# Patient Record
Sex: Male | Born: 1935 | Race: White | Hispanic: No | Marital: Married | Smoking: Never smoker
Health system: Southern US, Community
[De-identification: ages and names within clinical notes are randomized; demographics above are authoritative.]

## PROBLEM LIST (undated history)

## (undated) DIAGNOSIS — I2510 Atherosclerotic heart disease of native coronary artery without angina pectoris: Secondary | ICD-10-CM

## (undated) DIAGNOSIS — I509 Heart failure, unspecified: Secondary | ICD-10-CM

## (undated) DIAGNOSIS — Z950 Presence of cardiac pacemaker: Secondary | ICD-10-CM

## (undated) DIAGNOSIS — M1990 Unspecified osteoarthritis, unspecified site: Secondary | ICD-10-CM

## (undated) DIAGNOSIS — J9610 Chronic respiratory failure, unspecified whether with hypoxia or hypercapnia: Secondary | ICD-10-CM

## (undated) DIAGNOSIS — E119 Type 2 diabetes mellitus without complications: Secondary | ICD-10-CM

## (undated) DIAGNOSIS — R0602 Shortness of breath: Secondary | ICD-10-CM

## (undated) DIAGNOSIS — I739 Peripheral vascular disease, unspecified: Secondary | ICD-10-CM

## (undated) DIAGNOSIS — Z9862 Peripheral vascular angioplasty status: Secondary | ICD-10-CM

## (undated) DIAGNOSIS — J189 Pneumonia, unspecified organism: Secondary | ICD-10-CM

## (undated) DIAGNOSIS — F329 Major depressive disorder, single episode, unspecified: Secondary | ICD-10-CM

## (undated) DIAGNOSIS — I2699 Other pulmonary embolism without acute cor pulmonale: Secondary | ICD-10-CM

## (undated) DIAGNOSIS — I82403 Acute embolism and thrombosis of unspecified deep veins of lower extremity, bilateral: Secondary | ICD-10-CM

## (undated) DIAGNOSIS — F32A Depression, unspecified: Secondary | ICD-10-CM

## (undated) DIAGNOSIS — I219 Acute myocardial infarction, unspecified: Secondary | ICD-10-CM

## (undated) DIAGNOSIS — I209 Angina pectoris, unspecified: Secondary | ICD-10-CM

## (undated) DIAGNOSIS — N189 Chronic kidney disease, unspecified: Secondary | ICD-10-CM

## (undated) DIAGNOSIS — Z95810 Presence of automatic (implantable) cardiac defibrillator: Secondary | ICD-10-CM

## (undated) DIAGNOSIS — D696 Thrombocytopenia, unspecified: Secondary | ICD-10-CM

## (undated) DIAGNOSIS — I459 Conduction disorder, unspecified: Secondary | ICD-10-CM

## (undated) DIAGNOSIS — I10 Essential (primary) hypertension: Secondary | ICD-10-CM

## (undated) DIAGNOSIS — E785 Hyperlipidemia, unspecified: Secondary | ICD-10-CM

## (undated) DIAGNOSIS — J209 Acute bronchitis, unspecified: Secondary | ICD-10-CM

## (undated) DIAGNOSIS — K219 Gastro-esophageal reflux disease without esophagitis: Secondary | ICD-10-CM

## (undated) DIAGNOSIS — Q639 Congenital malformation of kidney, unspecified: Secondary | ICD-10-CM

## (undated) DIAGNOSIS — G4730 Sleep apnea, unspecified: Secondary | ICD-10-CM

## (undated) DIAGNOSIS — M109 Gout, unspecified: Secondary | ICD-10-CM

## (undated) DIAGNOSIS — I513 Intracardiac thrombosis, not elsewhere classified: Secondary | ICD-10-CM

## (undated) HISTORY — DX: Sleep apnea, unspecified: G47.30

## (undated) HISTORY — PX: CORONARY ANGIOPLASTY WITH STENT PLACEMENT: SHX49

## (undated) HISTORY — DX: Conduction disorder, unspecified: I45.9

## (undated) HISTORY — PX: REPLACEMENT TOTAL KNEE BILATERAL: SUR1225

## (undated) HISTORY — DX: Heart failure, unspecified: I50.9

## (undated) HISTORY — DX: Hyperlipidemia, unspecified: E78.5

## (undated) HISTORY — DX: Congenital malformation of kidney, unspecified: Q63.9

## (undated) HISTORY — PX: CORONARY ARTERY BYPASS GRAFT: SHX141

## (undated) HISTORY — DX: Morbid (severe) obesity due to excess calories: E66.01

## (undated) HISTORY — DX: Other pulmonary embolism without acute cor pulmonale: I26.99

## (undated) HISTORY — DX: Atherosclerotic heart disease of native coronary artery without angina pectoris: I25.10

## (undated) HISTORY — DX: Essential (primary) hypertension: I10

## (undated) HISTORY — PX: CHOLECYSTECTOMY: SHX55

## (undated) HISTORY — PX: JOINT REPLACEMENT: SHX530

## (undated) HISTORY — PX: EYE SURGERY: SHX253

## (undated) HISTORY — PX: CATARACT EXTRACTION W/ INTRAOCULAR LENS  IMPLANT, BILATERAL: SHX1307

## (undated) HISTORY — PX: OTHER SURGICAL HISTORY: SHX169

---

## 1999-02-25 ENCOUNTER — Inpatient Hospital Stay (HOSPITAL_COMMUNITY): Admission: AD | Admit: 1999-02-25 | Discharge: 1999-02-28 | Payer: Self-pay | Admitting: Cardiovascular Disease

## 1999-03-19 ENCOUNTER — Inpatient Hospital Stay (HOSPITAL_COMMUNITY): Admission: EM | Admit: 1999-03-19 | Discharge: 1999-03-21 | Payer: Self-pay | Admitting: Cardiovascular Disease

## 1999-03-19 ENCOUNTER — Encounter: Payer: Self-pay | Admitting: Cardiovascular Disease

## 1999-04-29 ENCOUNTER — Ambulatory Visit: Admission: RE | Admit: 1999-04-29 | Discharge: 1999-04-29 | Payer: Self-pay | Admitting: Internal Medicine

## 2001-04-14 ENCOUNTER — Encounter: Payer: Self-pay | Admitting: Orthopedic Surgery

## 2001-04-16 HISTORY — PX: SHOULDER OPEN ROTATOR CUFF REPAIR: SHX2407

## 2001-04-18 ENCOUNTER — Inpatient Hospital Stay (HOSPITAL_COMMUNITY): Admission: RE | Admit: 2001-04-18 | Discharge: 2001-04-19 | Payer: Self-pay | Admitting: Orthopedic Surgery

## 2002-07-27 ENCOUNTER — Ambulatory Visit (HOSPITAL_COMMUNITY): Admission: RE | Admit: 2002-07-27 | Discharge: 2002-07-27 | Payer: Self-pay | Admitting: Unknown Physician Specialty

## 2002-07-27 ENCOUNTER — Encounter: Payer: Self-pay | Admitting: Unknown Physician Specialty

## 2003-03-31 ENCOUNTER — Inpatient Hospital Stay (HOSPITAL_COMMUNITY): Admission: AD | Admit: 2003-03-31 | Discharge: 2003-04-03 | Payer: Self-pay | Admitting: Cardiovascular Disease

## 2003-08-29 ENCOUNTER — Ambulatory Visit (HOSPITAL_COMMUNITY): Admission: RE | Admit: 2003-08-29 | Discharge: 2003-08-29 | Payer: Self-pay | Admitting: Gastroenterology

## 2003-08-29 ENCOUNTER — Encounter (INDEPENDENT_AMBULATORY_CARE_PROVIDER_SITE_OTHER): Payer: Self-pay | Admitting: Specialist

## 2003-10-18 ENCOUNTER — Inpatient Hospital Stay (HOSPITAL_COMMUNITY): Admission: EM | Admit: 2003-10-18 | Discharge: 2003-10-23 | Payer: Self-pay | Admitting: Emergency Medicine

## 2004-06-23 ENCOUNTER — Inpatient Hospital Stay (HOSPITAL_COMMUNITY): Admission: AD | Admit: 2004-06-23 | Discharge: 2004-06-27 | Payer: Self-pay | Admitting: *Deleted

## 2004-07-02 ENCOUNTER — Inpatient Hospital Stay (HOSPITAL_COMMUNITY): Admission: RE | Admit: 2004-07-02 | Discharge: 2004-07-03 | Payer: Self-pay | Admitting: Cardiovascular Disease

## 2005-01-25 ENCOUNTER — Ambulatory Visit (HOSPITAL_COMMUNITY): Admission: RE | Admit: 2005-01-25 | Discharge: 2005-01-26 | Payer: Self-pay | Admitting: Cardiovascular Disease

## 2005-02-10 ENCOUNTER — Inpatient Hospital Stay (HOSPITAL_COMMUNITY): Admission: AD | Admit: 2005-02-10 | Discharge: 2005-02-16 | Payer: Self-pay | Admitting: Cardiology

## 2005-02-10 ENCOUNTER — Ambulatory Visit (HOSPITAL_COMMUNITY): Admission: RE | Admit: 2005-02-10 | Discharge: 2005-02-10 | Payer: Self-pay | Admitting: Cardiovascular Disease

## 2005-02-10 ENCOUNTER — Ambulatory Visit: Payer: Self-pay | Admitting: Internal Medicine

## 2005-03-22 ENCOUNTER — Ambulatory Visit (HOSPITAL_COMMUNITY): Admission: RE | Admit: 2005-03-22 | Discharge: 2005-03-22 | Payer: Self-pay | Admitting: Cardiovascular Disease

## 2005-04-02 ENCOUNTER — Ambulatory Visit: Payer: Self-pay | Admitting: Internal Medicine

## 2005-05-05 ENCOUNTER — Ambulatory Visit: Payer: Self-pay | Admitting: Internal Medicine

## 2005-06-03 ENCOUNTER — Ambulatory Visit: Payer: Self-pay | Admitting: Internal Medicine

## 2006-12-16 ENCOUNTER — Ambulatory Visit (HOSPITAL_COMMUNITY): Admission: RE | Admit: 2006-12-16 | Discharge: 2006-12-16 | Payer: Self-pay | Admitting: Cardiovascular Disease

## 2007-07-17 HISTORY — PX: INSERT / REPLACE / REMOVE PACEMAKER: SUR710

## 2007-08-10 ENCOUNTER — Ambulatory Visit: Payer: Self-pay | Admitting: Internal Medicine

## 2007-08-10 ENCOUNTER — Ambulatory Visit: Payer: Self-pay | Admitting: Pulmonary Disease

## 2007-08-10 ENCOUNTER — Inpatient Hospital Stay (HOSPITAL_COMMUNITY): Admission: EM | Admit: 2007-08-10 | Discharge: 2007-08-20 | Payer: Self-pay | Admitting: Emergency Medicine

## 2007-08-17 HISTORY — PX: OTHER SURGICAL HISTORY: SHX169

## 2007-08-25 ENCOUNTER — Encounter: Payer: Self-pay | Admitting: Internal Medicine

## 2007-09-04 ENCOUNTER — Ambulatory Visit: Payer: Self-pay

## 2007-11-21 ENCOUNTER — Ambulatory Visit: Payer: Self-pay | Admitting: Internal Medicine

## 2008-04-23 ENCOUNTER — Ambulatory Visit (HOSPITAL_COMMUNITY): Admission: RE | Admit: 2008-04-23 | Discharge: 2008-04-23 | Payer: Self-pay | Admitting: Internal Medicine

## 2008-11-04 ENCOUNTER — Encounter: Payer: Self-pay | Admitting: Internal Medicine

## 2009-03-05 ENCOUNTER — Encounter: Payer: Self-pay | Admitting: Internal Medicine

## 2009-09-09 ENCOUNTER — Encounter: Payer: Self-pay | Admitting: Internal Medicine

## 2010-03-03 ENCOUNTER — Inpatient Hospital Stay (HOSPITAL_COMMUNITY): Admission: EM | Admit: 2010-03-03 | Discharge: 2010-03-13 | Payer: Self-pay | Admitting: Cardiology

## 2010-03-04 ENCOUNTER — Encounter (INDEPENDENT_AMBULATORY_CARE_PROVIDER_SITE_OTHER): Payer: Self-pay | Admitting: Cardiology

## 2010-03-06 ENCOUNTER — Ambulatory Visit: Payer: Self-pay | Admitting: Cardiothoracic Surgery

## 2010-03-06 ENCOUNTER — Encounter: Payer: Self-pay | Admitting: Cardiothoracic Surgery

## 2010-04-15 ENCOUNTER — Emergency Department (HOSPITAL_COMMUNITY): Admission: EM | Admit: 2010-04-15 | Discharge: 2010-04-15 | Payer: Self-pay | Admitting: Emergency Medicine

## 2010-06-16 DIAGNOSIS — I219 Acute myocardial infarction, unspecified: Secondary | ICD-10-CM

## 2010-06-16 HISTORY — DX: Acute myocardial infarction, unspecified: I21.9

## 2010-08-11 ENCOUNTER — Inpatient Hospital Stay (HOSPITAL_COMMUNITY)
Admission: AD | Admit: 2010-08-11 | Discharge: 2010-08-21 | Payer: Self-pay | Source: Home / Self Care | Attending: Cardiovascular Disease | Admitting: Cardiovascular Disease

## 2010-08-13 ENCOUNTER — Encounter (INDEPENDENT_AMBULATORY_CARE_PROVIDER_SITE_OTHER): Payer: Self-pay | Admitting: Cardiovascular Disease

## 2010-08-19 LAB — GLUCOSE, CAPILLARY
Glucose-Capillary: 168 mg/dL — ABNORMAL HIGH (ref 70–99)
Glucose-Capillary: 173 mg/dL — ABNORMAL HIGH (ref 70–99)
Glucose-Capillary: 160 mg/dL — ABNORMAL HIGH (ref 70–99)
Glucose-Capillary: 159 mg/dL — ABNORMAL HIGH (ref 70–99)

## 2010-08-19 LAB — CBC
HCT: 41.2 % (ref 39.0–52.0)
Hemoglobin: 14 g/dL (ref 13.0–17.0)
MCH: 33.2 pg (ref 26.0–34.0)
MCHC: 34 g/dL (ref 30.0–36.0)
MCV: 97.6 fL (ref 78.0–100.0)
Platelets: 237 10*3/uL (ref 150–400)
RBC: 4.22 MIL/uL (ref 4.22–5.81)
RDW: 12.8 % (ref 11.5–15.5)
WBC: 6.8 10*3/uL (ref 4.0–10.5)

## 2010-08-19 LAB — PROTIME-INR
INR: 2.78 — ABNORMAL HIGH (ref 0.00–1.49)
Prothrombin Time: 29.4 seconds — ABNORMAL HIGH (ref 11.6–15.2)

## 2010-08-20 LAB — GLUCOSE, CAPILLARY
Glucose-Capillary: 162 mg/dL — ABNORMAL HIGH (ref 70–99)
Glucose-Capillary: 163 mg/dL — ABNORMAL HIGH (ref 70–99)
Glucose-Capillary: 145 mg/dL — ABNORMAL HIGH (ref 70–99)
Glucose-Capillary: 177 mg/dL — ABNORMAL HIGH (ref 70–99)

## 2010-08-20 LAB — BASIC METABOLIC PANEL
BUN: 34 mg/dL — ABNORMAL HIGH (ref 6–23)
CO2: 31 mEq/L (ref 19–32)
Calcium: 9.9 mg/dL (ref 8.4–10.5)
Chloride: 93 mEq/L — ABNORMAL LOW (ref 96–112)
Creatinine, Ser: 2.42 mg/dL — ABNORMAL HIGH (ref 0.4–1.5)
GFR calc Af Amer: 31 mL/min — ABNORMAL LOW (ref >60)
GFR calc non Af Amer: 26 mL/min — ABNORMAL LOW (ref >60)
Glucose, Bld: 157 mg/dL — ABNORMAL HIGH (ref 70–99)
Potassium: 3.6 mEq/L (ref 3.5–5.1)
Sodium: 137 mEq/L (ref 135–145)

## 2010-08-20 LAB — PROTIME-INR
INR: 2.98 — ABNORMAL HIGH (ref 0.00–1.49)
Prothrombin Time: 31 seconds — ABNORMAL HIGH (ref 11.6–15.2)

## 2010-08-20 LAB — MAGNESIUM: Magnesium: 2.2 mg/dL (ref 1.5–2.5)

## 2010-08-20 LAB — BRAIN NATRIURETIC PEPTIDE: Pro B Natriuretic peptide (BNP): 365 pg/mL — ABNORMAL HIGH (ref 0.0–100.0)

## 2010-08-21 LAB — PROTIME-INR
INR: 3.81 — ABNORMAL HIGH (ref 0.00–1.49)
Prothrombin Time: 37.5 seconds — ABNORMAL HIGH (ref 11.6–15.2)

## 2010-08-21 LAB — GLUCOSE, CAPILLARY
Glucose-Capillary: 159 mg/dL — ABNORMAL HIGH (ref 70–99)
Glucose-Capillary: 188 mg/dL — ABNORMAL HIGH (ref 70–99)

## 2010-09-04 DIAGNOSIS — G4730 Sleep apnea, unspecified: Secondary | ICD-10-CM | POA: Insufficient documentation

## 2010-09-04 DIAGNOSIS — E785 Hyperlipidemia, unspecified: Secondary | ICD-10-CM | POA: Insufficient documentation

## 2010-09-04 DIAGNOSIS — E119 Type 2 diabetes mellitus without complications: Secondary | ICD-10-CM | POA: Insufficient documentation

## 2010-09-04 DIAGNOSIS — M1990 Unspecified osteoarthritis, unspecified site: Secondary | ICD-10-CM | POA: Insufficient documentation

## 2010-09-04 DIAGNOSIS — K219 Gastro-esophageal reflux disease without esophagitis: Secondary | ICD-10-CM | POA: Insufficient documentation

## 2010-09-04 DIAGNOSIS — I10 Essential (primary) hypertension: Secondary | ICD-10-CM | POA: Insufficient documentation

## 2010-09-08 ENCOUNTER — Ambulatory Visit: Admit: 2010-09-08 | Payer: Self-pay | Attending: Physician Assistant | Admitting: Physician Assistant

## 2010-09-10 ENCOUNTER — Encounter: Payer: Self-pay | Admitting: Internal Medicine

## 2010-09-11 NOTE — Discharge Summary (Signed)
NAME:  Samuel Christensen, ARKO NO.:  [id number]  MEDICAL RECORD NO.:  [id number]          PATIENT TYPE:  INP  LOCATION:  2039                         FACILITY:  [facility]  PHYSICIAN:  Faye Strohman A. Alanda Amass, M.D.DATE OF BIRTH:  11/03/1924  DATE OF ADMISSION:  08/11/2010 DATE OF DISCHARGE:  08/21/2010                              DISCHARGE SUMMARY   DISCHARGE DIAGNOSES: 1. Acute-on-chronic congestive heart failure. 2. Ischemic cardiomyopathy with an ejection fraction of 25-30%. 3. Pulmonary embolism this admission in the setting of a     subtherapeutic INR. 4. History of previous pulmonary embolism in 2007. 5. Coronary artery disease with coronary artery bypass grafting in     1994, catheterization July 2011 with plans for medical therapy. 6. Stage IV chronic renal insufficiency. 7. Morbid obesity. 8. Sleep apnea, intolerant to CPAP. 9. Left bundle-branch block. 10.Peripheral vascular disease with prior right renal artery     percutaneous transluminal angioplasty November 2005.  HOSPITAL COURSE:  The patient is an 77 year old male well-known to Dr. Alanda Amass.  He presented to the office, fluid overloaded.  See Dr. Kandis Cocking complete H and P for details.  He was admitted to the telemetry, started on IV dobutamine and IV diuretics.  Dr. Alanda Amass wanted him evaluated by Dr. Graciela Husbands with the EP Service for possible upgrade of a previous pacemaker to a BiV ICD device.  The patient was admitted to the telemetry floor.  His INR at admission was 1.11 and he was put on heparin.  The patient was seen by Dr. Graciela Husbands.  After admission, the patient had dyspnea and a VQ scan on the 29th confirmed pulmonary embolism.  Lower extremity venous Dopplers showed positive for bilateral DVTs.  Echocardiogram showed his EF to be 20-25%.  The patient was transferred to the intensive care unit.  During this time he also had some increasing congestive heart failure and he was diuresed.  He was  monitored on dobutamine.  We were unable to keep an IV on him with the dobutamine going, so we put him on Lovenox to Coumadin.  His diuretics were adjusted over the holiday weekend and he continued to diurese.  He was transferred to telemetry and ambulated.  Dr. Graciela Husbands saw him on the 4th and suggested at this time that we continue to treat him medically.  He does not feel he is a candidate at this point for a upgrade to a BiV ICD.  Please see his consult note for complete details. The patient was seen by Dr. Lynnea Ferrier on the 6th and we feel he is ready for discharge.  His weight at discharge is 95 kg.  His weight on admission was 101.6 kg.  LABS AT DISCHARGE:  INR is 3.81.  BNP is 365.  Sodium 137, potassium 3.6, BUN 34, creatinine 2.42, white count 6.8, hemoglobin 14, hematocrit 41.2, platelets 237.  Chest x-ray on the 2nd shows an improved aeration. VQ scan on the 29th shows high probability for acute pulmonary embolism to the right lung.  Abdominal ultrasound showed fatty infiltration of the liver.  Did have a CT scan of his head without contrast that showed no  hemorrhage.  EKG shows a paced rhythm with a left bundle-branch block.  DISPOSITION:  The patient is discharged in stable condition.  He will need a INR on Monday.  We will hold his Coumadin today and give him his usual dose tomorrow.  He will see Dr. Alanda Amass in a week or so in followup.  Please see med rec for complete discharge medications.     Abelino Derrick, P.A.   ______________________________ Pearletha Furl Alanda Amass, M.D.    Lenard Lance  D:  08/21/2010  T:  08/21/2010  Job:  [id number]  Electronically Signed by Corine Shelter P.A. on 08/26/2010 10:09:58 AM Electronically Signed by Susa Griffins M.D. on 09/09/2010 12:47:18 PM

## 2010-09-11 NOTE — H&P (Signed)
NAME:  Samuel Christensen, Samuel Christensen NO.:  [id number]  MEDICAL RECORD NO.:  [id number]          PATIENT TYPE:  INP  LOCATION:  2040                         FACILITY:  [facility]  PHYSICIAN:  Richard A. Alanda Amass, M.D.DATE OF BIRTH:  06-22-1925  DATE OF ADMISSION:  08/11/2010 DATE OF DISCHARGE:                             HISTORY & PHYSICAL   Samuel Christensen is an 77 year old white married father of 7, with 2 GC and 13 GGC.  He has never been a smoker.  He has multiple significant cardiovascular problems and a complex medical history.  He has coronary artery disease, remote CABG, remote pulmonary emboli, obstructive sleep apnea on CPAP, worsening LV function, permanent pacemaker with pacer- induced left bundle, renal artery stenosis, renal insufficiency, severe graft disease with past SCMI in July 2011.  He is being admitted now with worsening shortness of breath over the last 3-4 days.  He has not had any chest pain or increasing edema and denies any significant dietary salt indiscretion over the Christmas holidays.  Samuel Christensen has remote CABG in 1994 by Dr. Andrey Campanile when he has had multiple interventions dating back to 2000 with predominantly non-DES stents. His last intervention was in December 2007 when he had DES stenting of the SVG to a small diagonal and the proximal and middle thirds and his other stents were patent in his native LAD (from 2000 intervention with an SVG to distal OM and distal circumflex).  He had patent graft to RV marginals and the PDA with an intercurrent 70% lesion of the PLA beyond the anastomosis.  He had 3 prior stents of the SVG sequential to the marginals that were patent at that time and this stented diagonal graft supplied a moderate-sized diagonal  He was recently restudied in July 2011 and was found to have reduction in EF to about 30% with inferior wall motion abnormality.  He had haziness with possibly representing resolved thrombus  without significant stenosis of the SVG to the right.  The SVG to the circumflex marginal and SVG to the diagonals were occluded.  His LIMA to LAD was atretic, but he had patent LAD.  It was recommended that he be continued on medical therapy for this and he had a pacer-induced left bundle.  He is being followed for renal insufficiency, salt water retention on diuretics as an outpatient and lisinopril is being discontinued when BUN and creatinine was elevated to 59/2.26.  He has chronic renal insufficiency, but may have had a component of contrast-induced nephropathy from his prior catheterization in July despite limited dye.  He has also had CO2 angiography with left renal dilatation and stenting in 2005.  He had refused angiography in 2006 which showed 40% right and 30% left renal artery in-stent restenosis.  There was mid residual lumen.  His last renal Dopplers showed no significant increase in velocities on August 06, 2010.  He has had bilateral pulmonary emboli in 2007 and has been treated with chronic Coumadin therapy. He had permanent pacemaker implanted in December 2008 for symptomatic bradycardia by Dr. Lewayne Bunting for associated intermittent heart block. He did require subsequent lead repositioning which was  successful by Dr. Ladona Ridgel.  We have felt that he would probably require consideration for ICD based on his worsening LV function and that he might be a candidate for BiV pacing however, this might require at least limited dye to assess him for the LV lead.  His last 2-D echo of July 02, 2010, showed an EF of 20-25% with pacer-induced left bundle and diastolic dysfunction.  Mild AI with aortic sclerosis and mild MR, left atrial size of 4.2.  He has not had any recent chest pain or nitroglycerin use.  He has been sleeping on 1 very large pillow equivalent to 2-3 pillows over the last 3 days and as mentioned he has not had any increase in edema, but has had  progressive shortness of breath.  He was brought into the office today by his wife and daughter.  Last laboratory of July 24, 2010, showed H&H of 14.5/45.9, BUN and creatinine was improved to 26/1.58 off lisinopril, glucose 135, normal LFTs, A1c of 6.6, and a BMP of 396.  Samuel Christensen has sleep apnea, on CPAP, exogenous obesity.  Permanent pacemaker was implanted in December 2008 as outlined above and is a [employer].  CURRENT MEDICATIONS: 1. TriCor 145. 2. Pepcid 20. 3. Vytorin 10/20. 4. Lasix 60. 5. Niacin 1 g. 6. Lexapro 20. 7. Bystolic 5. 8. Imdur 30. 9. Azatadine 2.5 mg 3 days, 5 mg 4 days, 10.M.V.I. 11.Fish oil. 12.Pepcid 20.  PHYSICAL EXAMINATION:  He has mild cyanosis of the lips and mild to moderate shortness of breath at rest and moderate shortness of breath or greater with limited exertion, getting up and down from the exam table. His O2 sat initially was 88% on room air, it came up to 90%.  His chest is clear to P&A (nonsmoker, never smoked).  He is short-statured, mesomorphic habitus, height 5 feet and 5.5 inch and weight 224.  BMI is 37.  He has mild JVD at 20-30 degrees, diminished breath sounds bilaterally with no moist rales and prolonged expiration, but no expiratory wheezes.  The PMI is in the left sixth ICS, just outside the [facility] poorly sustained.  There is paradoxical splitting of the second heart sound and a 2/6 systolic murmur at the left sternal border, probably representing TR.  There is no parasternal lift.  There is no AI murmur on exam.  Abdomen is profoundly obese.  Ventral hernia is asymptomatic and there is no abdominal tenderness.  Femoral is intact. DP and PT intact and there is no edema.  Underlying EKG shows complete heart block with an atrial rate of 95-100 and is pacer dependent down to a rate of 30.  He has a pacer-induced left bundle with atrial tracking and ventricular pacing and QRS duration of 0.204.  Of note, prior  EF on 2-D echo of February 2011 was 45% with the pacer-induced left bundle and subsequent 2-D echo showed significant deterioration with last EF of 20-25% on July 02, 2010.  He has not had any arrhythmias recorded, specifically there has AF, SVT, or v tach.  RA threshold 0.6 at 0.5, RV threshold 0.8 at 0.5.  R-waves are paced and he is pacer dependent down to 30 and P-waves measure 1.3 mV.  RA impedance 580, RV impedance 470 ohms.  The generator is a [employer] implanted in December 2008.  Samuel Christensen has had significant clinical decompensation with PND, orthopnea, worsening shortness of breath, and hypoxemia.  He has not had any chest pain to suggest SEMI or recurrent pulmonary  emboli and he remains on chronic Coumadin therapy.Samuel Christensen is being admitted to the hospital for treatment of LV dysfunction and CHF.  He will probably require inotropic therapy at present for his LV dysfunction.  I would recommend that he have EP evaluation for purposes of consideration for BiV upgrade with his chronic LV dysfunction and clinical CHF.  He has also had renal insufficiency.  I would recommend renal consultation and he has been followed by Dr. Marina Gravel in the past with labs and followup as outlined.  He may require extra diuresis.  He may require a V/Q scanning, although he has been on chronic Coumadin therapy which presumably has been therapeutic and we are trying to check into that since his protimes were done in [place].  He has not had any recurrent chest pain.  He has recurrent ischemia and he has significant coronary artery disease with occluded grafts to his circumflex and diagonal, patent native LAD which is stented with Cypher stent from remote PCI, patent sequential grafts to RV marginals, PDA, and PLA on prior and most recent catheterizations and no progression of renal artery stenosis on renal Dopplers.  ADMISSION DIAGNOSES: 1. Progressive left ventricular  dysfunction with pacer-induced left     bundle and congestive heart failure exacerbation worsening x3-4     days with recent chronic congestive heart failure. 2. ASHD, status post remote coronary artery bypass grafting, Dr.     Andrey Campanile, 1994. 3. Multiple prior percutaneous coronary interventions with remote drug-     eluting stent left anterior descending stenting, remote extensive     stenting of saphenous vein graft to distal circumflex sequential     and saphenous vein graft to diagonal in 2006. 4. Patent sequential saphenous vein graft to right ventricular     marginals, posterior descending artery, and posterolateral artery,     occluded saphenous vein graft to circumflex, occluded saphenous     vein graft to diagonal, atretic left internal mammary artery to     left anterior descending with patent native left anterior     descending on last catheterization in July 2011. 5. Chronic renal insufficiency, improved, off ACE therapy. 6. Remote bilateral pulmonary emboli in 2007. 7. Remote left renal artery stenting with CO2 angiography in November     2005.  No restenosis on recent followup renal Dopplers on August 06, 2010. 8. Exogenous obesity - ventral hernia. 9. Obstructive sleep apnea, chronic, continuous positive airway     pressure. 10.PTBP for heart block on August 11, 2007, subsequently lead     repositioning, Dr. Lewayne Bunting, pacer-induced left bundle. 11.Progressive congestive heart failure clinically and on 2-D echo as     outlined above, last 2-D echo July 02, 2010, ejection fraction     less than 25%. 12.Pacer-dependent underlying heart block, pacer-induced left bundle,     QRS duration 204. 13.Chronic renal insufficiency. 14.Hyperlipidemia.     Richard A. Alanda Amass, M.D.     RAW/MEDQ  D:  08/11/2010  T:  08/11/2010  Job:  [id number]  cc:   Wilber Bihari. Caryn Section, M.D. Larina Earthly, M.D.  Electronically Signed by Susa Griffins M.D. on 09/09/2010 12:47:21  PM

## 2010-09-17 NOTE — Letter (Signed)
Summary: [facility]  [facility]   Imported By: Sherian Rein 09/19/2009 08:50:59  _____________________________________________________________________  External Attachment:    Type:   Image     Comment:   External Document

## 2010-09-23 ENCOUNTER — Encounter: Payer: Self-pay | Admitting: Internal Medicine

## 2010-10-13 ENCOUNTER — Other Ambulatory Visit: Payer: Self-pay | Admitting: Internal Medicine

## 2010-10-13 ENCOUNTER — Encounter: Payer: Self-pay | Admitting: Internal Medicine

## 2010-10-13 ENCOUNTER — Ambulatory Visit (INDEPENDENT_AMBULATORY_CARE_PROVIDER_SITE_OTHER): Payer: Medicare Other | Admitting: Internal Medicine

## 2010-10-13 DIAGNOSIS — Z950 Presence of cardiac pacemaker: Secondary | ICD-10-CM

## 2010-10-13 DIAGNOSIS — I2782 Chronic pulmonary embolism: Secondary | ICD-10-CM | POA: Insufficient documentation

## 2010-10-13 DIAGNOSIS — I5022 Chronic systolic (congestive) heart failure: Secondary | ICD-10-CM | POA: Insufficient documentation

## 2010-10-13 DIAGNOSIS — J449 Chronic obstructive pulmonary disease, unspecified: Secondary | ICD-10-CM | POA: Insufficient documentation

## 2010-10-13 DIAGNOSIS — I2589 Other forms of chronic ischemic heart disease: Secondary | ICD-10-CM

## 2010-10-13 DIAGNOSIS — I442 Atrioventricular block, complete: Secondary | ICD-10-CM

## 2010-10-13 DIAGNOSIS — N259 Disorder resulting from impaired renal tubular function, unspecified: Secondary | ICD-10-CM

## 2010-10-13 DIAGNOSIS — J4489 Other specified chronic obstructive pulmonary disease: Secondary | ICD-10-CM | POA: Insufficient documentation

## 2010-10-13 LAB — BASIC METABOLIC PANEL
BUN: 33 mg/dL — ABNORMAL HIGH (ref 6–23)
CO2: 32 mEq/L (ref 19–32)
Chloride: 93 mEq/L — ABNORMAL LOW (ref 96–112)
GFR: 33.25 mL/min — ABNORMAL LOW (ref >60.00)
Potassium: 4.2 mEq/L (ref 3.5–5.1)
Sodium: 135 mEq/L (ref 135–145)

## 2010-10-15 HISTORY — PX: INSERT / REPLACE / REMOVE PACEMAKER: SUR710

## 2010-10-15 HISTORY — PX: CARDIAC DEFIBRILLATOR PLACEMENT: SHX171

## 2010-10-20 ENCOUNTER — Telehealth (INDEPENDENT_AMBULATORY_CARE_PROVIDER_SITE_OTHER): Payer: Self-pay | Admitting: *Deleted

## 2010-10-22 NOTE — Letter (Signed)
Summary: Implantable Device Instructions  Architectural technologist, Main Office  [address]   [place], [place] [address]   Phone: [phone]  Fax: [phone]      Implantable Device Instructions  You are scheduled for:  ___x__ Permanent Transvenous Pacemaker  DEVICE  UPGRADE _____ Implantable Cardioverter Defibrillator _____ Implantable Loop Recorder _____ Generator Change  on _3/15/12____ with Dr. _KLEIN____.  1.  Please arrive at the Short Stay Center at [facility] at __10:30__AM_ on the day of your procedure.  2.  Do not eat or drink the night before your procedure.  3.  Complete lab work on __3/12/12___.  The lab at [facility] is open from 8:30 AM to 1:30 PM and from 2:30 PM to 5:00 PM.  The lab at [facility] is open from 7:30 AM to 5:30 PM.  You do not have to be fasting.  4.  Do NOT take these medications for ____ days prior to your procedure:  _________________________.  Take your last dose of Coumadin on ________.  5.  Plan for an overnight stay.  Bring your insurance cards and a list of your medications.  6.  Wash your chest and neck with antibacterial soap (any brand) the evening before and the morning of your procedure.  Rinse well.             *If you have ANY questions after you get home, please call the office [phone].  *Every attempt is made to prevent procedures from being rescheduled.  Due to the nauture of Electrophysiology, rescheduling can happen.  The physician is always aware and directs the staff when this occurs.

## 2010-10-22 NOTE — Assessment & Plan Note (Signed)
Summary: Samuel Christensen   History of Present Illness: Samuel Christensen is seen following consultation at the hospital for consideration of CRT-P upgrade of DDD device in the context of complete heart block and ischemic CM with prior CABG 1991 EF by echo 07/2010 ws 25 %    He also has history of pulmonary embolism for whichhe has been on coumadin. at the last hospitalization, recurrent pulmonary embolism turned out to be because of the rapid deterioration in functional status that had prompted the hospitalization. Improve anticoagulation and time have left him mostly back to baseline. He is now able to ambulate around the house and lie flat although he does use oxygen.  His last catheterization in December 2000 less than on presentation with non-STEMI demonstrated Cardiac catheterization films are reviewed.  The left internal mammary  is not visualized and presume occluded, the circ vein graft is open  supplying a posterior descending, appears to have a distal anastomosis  stenosis.  The proximal circumflex of right occluded.  The vein grafts   to the circ diagonal are occluded with left ventricle appears underfilled, end-diastolic pressure was 24, ejection fraction 25-30%  with some degree of mitral regurgitation, PA pressure estimated at 37 with moderate-to- severe mitral regurgitation inferior hypokinesis. medical therapy was recommended.  he also has significant renal insufficiency. This has been a recurring theme. The most recent creatinine is January 5 was2.42. He tolerated his catheter in July quite well with a bump in his creatinine from 1.5-1.9.    Current Medications (verified): 1)  Imdur 30 Mg Xr24h-Tab (Isosorbide Mononitrate) .... Take One Tablet Once Daily 2)  Tricor 145 Mg Tabs (Fenofibrate) .... Take One Tablet Once Daily 3)  Bystolic 5 Mg Tabs (Nebivolol Hcl) .... Take One Tablet 4)  Pepcid 20 Mg Tabs (Famotidine) .... Take One Tablet Once Daily 5)  Lexapro 20 Mg Tabs (Escitalopram Oxalate) ....  Take One Tablet Once Daily 6)  Vytorin 10-20 Mg Tabs (Ezetimibe-Simvastatin) .... Take One Tablet Once Daily 7)  Niacinamide 500 Mg Tabs (Niacinamide) .... Take One Tablet Once Daily 8)  Jantoven 5 Mg Tabs (Warfarin Sodium) .... Take One Tablet 4 Days A Week and 1/2 Tablet 3 Days A Week 9)  Potassium Chloride Cr 10 Meq Cr-Tabs (Potassium Chloride) .... Take One Tablet Once Daily 10)  Furosemide 40 Mg Tabs (Furosemide) .Marland Kitchen.. 1 By Mouth Daily 11)  Metoprolol Tartrate 25 Mg Tabs (Metoprolol Tartrate) .... Take One Tablet Once Daily 12)  Centrum Silver  Tabs (Multiple Vitamins-Minerals) .... Once Daily 13)  Fish Oil 1000 Mg Caps (Omega-3 Fatty Acids) .... Take Two Capsules Once Daily  Allergies (verified): No Known Drug Allergies  Past History:  Past Surgical History: Last updated: 09/04/2010  1. Coronary artery bypass grafting x9 in 1991, we are still trying to       obtain the report of this operation.   2. History of permanent pacemaker in December 2008, when he had       bradycardic arrest requiring intubation secondary to complete heart       block and in early January 2009, had a revision of the pacer lead,       he has had bilateral knee replacements.   Family History: Last updated: 09/04/2010  He is married.  He has 7 children and 13 grandchildren.   He does not use cigarettes or alcohol.      His family history is noncontributory for congestive heart failure.      Social History: Last updated: 09/04/2010  The patient is married, retired, lives with his wife   who has recently been hospitalized for pneumonia and is currently living   in nursing home.  Denies alcohol use.      Past Medical History: 1. Renal insufficiency, concerned about contrast nephropathy.   2. Morbid obesity.   3. Sleep apnea.   coronary artery disease with prior bypass surgery Congestive heart failure-chronic-systolic Complete heart block status post pacemaker implantation Pulmonary  embolism-recurrent  7. Hypertension.   8. Dyslipidemia.   9. Diabetes.   Vital Signs:  Patient profile:   77 year old male Height:      65 inches Weight:      209 pounds BMI:     34.91 Pulse rate:   83 / minute BP sitting:   99 / 52  (left arm) Cuff size:   large  Vitals Entered By: Judithe Modest CMA (October 13, 2010 11:41 AM)  Physical Exam  General:  Well developed, he gets acutely obese white male appearing his stated age in mild respiratorydistress using oxygen Head:  normal HEENT Neck:  nondiscernible neck veins supple Chest Wall:  without kyphosis Lungs:  clear Heart:  regular rate and rhythm Abdomen:  soft nontender without hepatomegaly Msk:  Back normal, normal gait. Muscle strength and tone normal. Pulses:  intact distal pulses Extremities:  no clubbing cyanosis or edema Neurologic:  alert and oriented decreased hearing grossly normal motor and sensory function walks with a stick Skin:  warm and dry Psych:  engaging affect   PPM Specifications Following MD:  Inactive     Referring MD:  Susa Griffins, MD PPM Vendor:  [employer]     PPM Model Number:  [id number]     PPM Serial Number:  [id number] PPM DOI:  08/11/2007     PPM Implanting MD:  Sherryl Manges, MD  Lead 1    Location: RV     DOI: 08/18/2007     Model #: [id number]     Serial #: [id number]     Status: active  Magnet Response Rate:  BOL 100 ERI  85  Indications:  CHB   Impression & Recommendations:  Problem # 1:  SYSTOLIC HEART FAILURE, CHRONIC (ICD-428.22) the patient has chronic congestive heart failure. He is somewhat improved following hospitalization for pulmonary embolism. He has persistent class III symptoms. We have discussed the potential benefits and risks of upgrade of his DDD pacemaker to CRT P. The latter include but are not limited to death perforation, infection and renal injury. He understands these risks. We will measure his creatinine today. I will then discuss with Dr. Caryn Section with her  preprocedural hydration might be of benefit.  We will plan to undertake access without venography initially.  His updated medication list for this problem includes:    Imdur 30 Mg Xr24h-tab (Isosorbide mononitrate) .Marland Kitchen... Take one tablet once daily    Bystolic 5 Mg Tabs (Nebivolol hcl) .Marland Kitchen... Take one tablet    Jantoven 5 Mg Tabs (Warfarin sodium) .[employer] Kitchen... Take one tablet 4 days a week and 1/2 tablet 3 days a week    Furosemide 40 Mg Tabs (Furosemide) .Marland Kitchen... 1 by mouth daily    Metoprolol Tartrate 25 Mg Tabs (Metoprolol tartrate) .Marland Kitchen... Take one tablet once daily  Problem # 2:  CARDIOMYOPATHY, ISCHEMIC (ICD-414.8)  medical therapy is our option here and will be continued.  His updated medication list for this problem includes:    Imdur 30 Mg Xr24h-tab (Isosorbide mononitrate) .Marland Kitchen... Take one tablet once  daily    Bystolic 5 Mg Tabs (Nebivolol hcl) .Marland Kitchen... Take one tablet    Jantoven 5 Mg Tabs (Warfarin sodium) .Marland Kitchen... Take one tablet 4 days a week and 1/2 tablet 3 days a week    Furosemide 40 Mg Tabs (Furosemide) .Marland Kitchen... 1 by mouth daily    Metoprolol Tartrate 25 Mg Tabs (Metoprolol tartrate) .Marland Kitchen... Take one tablet once daily  Problem # 3:  RENAL DISEASE, CHRONIC, STAGE IV (ICD-585.4) as above  Problem # 4:  CHRONIC PULMONARY EMBOLISM (ICD-416.2) stable on current regime  Problem # 5:  PACEMAKER, PERMANENT (ICD-V45.01) the patient has a [employer] pacemaker which will be submitted for CRT upgrade  Other Orders: TLB-BMP (Basic Metabolic Panel-BMET) (80048-METABOL)

## 2010-10-26 ENCOUNTER — Other Ambulatory Visit (INDEPENDENT_AMBULATORY_CARE_PROVIDER_SITE_OTHER): Payer: Medicare Other

## 2010-10-26 ENCOUNTER — Other Ambulatory Visit: Payer: Self-pay | Admitting: Internal Medicine

## 2010-10-26 ENCOUNTER — Encounter: Payer: Self-pay | Admitting: Internal Medicine

## 2010-10-26 DIAGNOSIS — Z01810 Encounter for preprocedural cardiovascular examination: Secondary | ICD-10-CM

## 2010-10-26 DIAGNOSIS — I2589 Other forms of chronic ischemic heart disease: Secondary | ICD-10-CM

## 2010-10-26 DIAGNOSIS — N184 Chronic kidney disease, stage 4 (severe): Secondary | ICD-10-CM

## 2010-10-26 DIAGNOSIS — I509 Heart failure, unspecified: Secondary | ICD-10-CM

## 2010-10-26 LAB — GLUCOSE, CAPILLARY
Glucose-Capillary: 136 mg/dL — ABNORMAL HIGH (ref 70–99)
Glucose-Capillary: 146 mg/dL — ABNORMAL HIGH (ref 70–99)
Glucose-Capillary: 152 mg/dL — ABNORMAL HIGH (ref 70–99)
Glucose-Capillary: 165 mg/dL — ABNORMAL HIGH (ref 70–99)
Glucose-Capillary: 171 mg/dL — ABNORMAL HIGH (ref 70–99)
Glucose-Capillary: 173 mg/dL — ABNORMAL HIGH (ref 70–99)
Glucose-Capillary: 176 mg/dL — ABNORMAL HIGH (ref 70–99)
Glucose-Capillary: 179 mg/dL — ABNORMAL HIGH (ref 70–99)
Glucose-Capillary: 183 mg/dL — ABNORMAL HIGH (ref 70–99)
Glucose-Capillary: 212 mg/dL — ABNORMAL HIGH (ref 70–99)
Glucose-Capillary: 229 mg/dL — ABNORMAL HIGH (ref 70–99)

## 2010-10-26 LAB — BASIC METABOLIC PANEL
BUN: 31 mg/dL — ABNORMAL HIGH (ref 6–23)
CO2: 24 mEq/L (ref 19–32)
CO2: 31 mEq/L (ref 19–32)
Calcium: 10.1 mg/dL (ref 8.4–10.5)
Calcium: 9.7 mg/dL (ref 8.4–10.5)
Chloride: 100 mEq/L (ref 96–112)
Chloride: 95 mEq/L — ABNORMAL LOW (ref 96–112)
Chloride: 96 mEq/L (ref 96–112)
Chloride: 99 mEq/L (ref 96–112)
Creatinine, Ser: 2.05 mg/dL — ABNORMAL HIGH (ref 0.4–1.5)
GFR calc Af Amer: 34 mL/min — ABNORMAL LOW (ref >60)
GFR calc Af Amer: 36 mL/min — ABNORMAL LOW (ref >60)
GFR calc Af Amer: 37 mL/min — ABNORMAL LOW (ref >60)
GFR calc Af Amer: 38 mL/min — ABNORMAL LOW (ref >60)
GFR calc Af Amer: 39 mL/min — ABNORMAL LOW (ref >60)
GFR calc non Af Amer: 30 mL/min — ABNORMAL LOW (ref >60)
GFR calc non Af Amer: 33 mL/min — ABNORMAL LOW (ref >60)
Glucose, Bld: 160 mg/dL — ABNORMAL HIGH (ref 70–99)
Glucose, Bld: 213 mg/dL — ABNORMAL HIGH (ref 70–99)
Potassium: 3.5 mEq/L (ref 3.5–5.1)
Potassium: 4.3 mEq/L (ref 3.5–5.1)
Potassium: 4.3 mEq/L (ref 3.5–5.1)
Sodium: 136 mEq/L (ref 135–145)
Sodium: 138 mEq/L (ref 135–145)
Sodium: 139 mEq/L (ref 135–145)
Sodium: 139 mEq/L (ref 135–145)
Sodium: 135 mEq/L (ref 135–145)

## 2010-10-26 LAB — PROTIME-INR
INR: 1.17 (ref 0.00–1.49)
INR: 1.18 (ref 0.00–1.49)
INR: 1.25 (ref 0.00–1.49)
INR: 1.29 (ref 0.00–1.49)
INR: 1.8 — ABNORMAL HIGH (ref 0.00–1.49)
Prothrombin Time: 15.4 seconds — ABNORMAL HIGH (ref 11.6–15.2)
Prothrombin Time: 15.9 seconds — ABNORMAL HIGH (ref 11.6–15.2)
Prothrombin Time: 16.3 seconds — ABNORMAL HIGH (ref 11.6–15.2)
Prothrombin Time: 21.1 seconds — ABNORMAL HIGH (ref 11.6–15.2)

## 2010-10-26 LAB — COMPREHENSIVE METABOLIC PANEL
ALT: 15 U/L (ref 0–53)
ALT: 16 U/L (ref 0–53)
ALT: 20 U/L (ref 0–53)
AST: 24 U/L (ref 0–37)
AST: 31 U/L (ref 0–37)
Albumin: 2.6 g/dL — ABNORMAL LOW (ref 3.5–5.2)
Albumin: 3.3 g/dL — ABNORMAL LOW (ref 3.5–5.2)
Albumin: 3.9 g/dL (ref 3.5–5.2)
Alkaline Phosphatase: 41 U/L (ref 39–117)
CO2: 26 mEq/L (ref 19–32)
CO2: 28 mEq/L (ref 19–32)
CO2: 29 mEq/L (ref 19–32)
Calcium: 9.2 mg/dL (ref 8.4–10.5)
Calcium: 9.7 mg/dL (ref 8.4–10.5)
Chloride: 101 mEq/L (ref 96–112)
Chloride: 102 mEq/L (ref 96–112)
Chloride: 93 mEq/L — ABNORMAL LOW (ref 96–112)
Creatinine, Ser: 1.84 mg/dL — ABNORMAL HIGH (ref 0.4–1.5)
Creatinine, Ser: 1.93 mg/dL — ABNORMAL HIGH (ref 0.4–1.5)
GFR calc Af Amer: 36 mL/min — ABNORMAL LOW (ref >60)
GFR calc Af Amer: 40 mL/min — ABNORMAL LOW (ref >60)
GFR calc Af Amer: 43 mL/min — ABNORMAL LOW (ref >60)
GFR calc non Af Amer: 30 mL/min — ABNORMAL LOW (ref >60)
GFR calc non Af Amer: 33 mL/min — ABNORMAL LOW (ref >60)
Potassium: 3.6 mEq/L (ref 3.5–5.1)
Potassium: 4.7 mEq/L (ref 3.5–5.1)
Sodium: 137 mEq/L (ref 135–145)
Total Bilirubin: 0.9 mg/dL (ref 0.3–1.2)
Total Bilirubin: 0.9 mg/dL (ref 0.3–1.2)
Total Protein: 7.4 g/dL (ref 6.0–8.3)

## 2010-10-26 LAB — MAGNESIUM: Magnesium: 2 mg/dL (ref 1.5–2.5)

## 2010-10-26 LAB — CBC WITH DIFFERENTIAL/PLATELET
Basophils Absolute: 0 10*3/uL (ref 0.0–0.1)
Eosinophils Absolute: 0.2 10*3/uL (ref 0.0–0.7)
HCT: 40 % (ref 39.0–52.0)
Hemoglobin: 13.9 g/dL (ref 13.0–17.0)
Lymphs Abs: 1.8 10*3/uL (ref 0.7–4.0)
MCHC: 34.9 g/dL (ref 30.0–36.0)
Neutro Abs: 3.1 10*3/uL (ref 1.4–7.7)
Platelets: 179 10*3/uL (ref 150.0–400.0)
RDW: 14.1 % (ref 11.5–14.6)

## 2010-10-26 LAB — BRAIN NATRIURETIC PEPTIDE
Pro B Natriuretic peptide (BNP): 1104 pg/mL — ABNORMAL HIGH (ref 0.0–100.0)
Pro B Natriuretic peptide (BNP): 544 pg/mL — ABNORMAL HIGH (ref 0.0–100.0)
Pro B Natriuretic peptide (BNP): 551 pg/mL — ABNORMAL HIGH (ref 0.0–100.0)
Pro B Natriuretic peptide (BNP): 822 pg/mL — ABNORMAL HIGH (ref 0.0–100.0)

## 2010-10-26 LAB — CBC
HCT: 36.6 % — ABNORMAL LOW (ref 39.0–52.0)
HCT: 40.3 % (ref 39.0–52.0)
HCT: 41.7 % (ref 39.0–52.0)
HCT: 43.8 % (ref 39.0–52.0)
Hemoglobin: 12.1 g/dL — ABNORMAL LOW (ref 13.0–17.0)
Hemoglobin: 13.3 g/dL (ref 13.0–17.0)
Hemoglobin: 13.5 g/dL (ref 13.0–17.0)
Hemoglobin: 14.1 g/dL (ref 13.0–17.0)
MCH: 32.3 pg (ref 26.0–34.0)
MCH: 32.7 pg (ref 26.0–34.0)
MCHC: 32.5 g/dL (ref 30.0–36.0)
MCHC: 33.8 g/dL (ref 30.0–36.0)
MCV: 98.9 fL (ref 78.0–100.0)
MCV: 99.5 fL (ref 78.0–100.0)
MCV: 99.5 fL (ref 78.0–100.0)
Platelets: 136 10*3/uL — ABNORMAL LOW (ref 150–400)
Platelets: 137 10*3/uL — ABNORMAL LOW (ref 150–400)
Platelets: 143 10*3/uL — ABNORMAL LOW (ref 150–400)
Platelets: 154 10*3/uL (ref 150–400)
Platelets: 180 10*3/uL (ref 150–400)
RBC: 3.69 MIL/uL — ABNORMAL LOW (ref 4.22–5.81)
RBC: 3.7 MIL/uL — ABNORMAL LOW (ref 4.22–5.81)
RBC: 3.88 MIL/uL — ABNORMAL LOW (ref 4.22–5.81)
RBC: 4.05 MIL/uL — ABNORMAL LOW (ref 4.22–5.81)
RBC: 4.13 MIL/uL — ABNORMAL LOW (ref 4.22–5.81)
RBC: 4.27 MIL/uL (ref 4.22–5.81)
RDW: 13.1 % (ref 11.5–15.5)
RDW: 13.6 % (ref 11.5–15.5)
WBC: 10.1 10*3/uL (ref 4.0–10.5)
WBC: 6.5 10*3/uL (ref 4.0–10.5)
WBC: 6.8 10*3/uL (ref 4.0–10.5)
WBC: 7.2 10*3/uL (ref 4.0–10.5)
WBC: 8.6 10*3/uL (ref 4.0–10.5)
WBC: 9.1 10*3/uL (ref 4.0–10.5)
WBC: 9.5 10*3/uL (ref 4.0–10.5)

## 2010-10-26 LAB — DIFFERENTIAL
Basophils Relative: 0 % (ref 0–1)
Eosinophils Absolute: 0.1 10*3/uL (ref 0.0–0.7)
Eosinophils Relative: 1 % (ref 0–5)
Lymphs Abs: 2 10*3/uL (ref 0.7–4.0)
Monocytes Absolute: 0.8 10*3/uL (ref 0.1–1.0)
Monocytes Relative: 8 % (ref 3–12)

## 2010-10-26 LAB — HEPATIC FUNCTION PANEL
AST: 23 U/L (ref 0–37)
Bilirubin, Direct: 0.3 mg/dL (ref 0.0–0.3)
Total Bilirubin: 0.9 mg/dL (ref 0.3–1.2)

## 2010-10-26 LAB — URINALYSIS, ROUTINE W REFLEX MICROSCOPIC
Hgb urine dipstick: NEGATIVE
Nitrite: NEGATIVE
Protein, ur: NEGATIVE mg/dL
Specific Gravity, Urine: 1.017 (ref 1.005–1.030)
pH: 5 (ref 5.0–8.0)

## 2010-10-26 LAB — BLOOD GAS, ARTERIAL
Bicarbonate: 25.3 mEq/L — ABNORMAL HIGH (ref 20.0–24.0)
O2 Content: 6 L/min
pH, Arterial: 7.428 (ref 7.350–7.450)

## 2010-10-26 LAB — HEMOGLOBIN A1C: Mean Plasma Glucose: 148 mg/dL — ABNORMAL HIGH (ref <117)

## 2010-10-26 LAB — APTT
aPTT: 82 seconds — ABNORMAL HIGH (ref 24–37)
aPTT: 26.7 s (ref 21.7–28.8)

## 2010-10-26 LAB — CARDIAC PANEL(CRET KIN+CKTOT+MB+TROPI)
Relative Index: INVALID (ref 0.0–2.5)
Relative Index: INVALID (ref 0.0–2.5)
Relative Index: INVALID (ref 0.0–2.5)
Relative Index: INVALID (ref 0.0–2.5)
Total CK: 75 U/L (ref 7–232)
Troponin I: 0.07 ng/mL — ABNORMAL HIGH (ref 0.00–0.06)

## 2010-10-26 LAB — HEPARIN LEVEL (UNFRACTIONATED)
Heparin Unfractionated: 0.39 IU/mL (ref 0.30–0.70)
Heparin Unfractionated: 0.46 IU/mL (ref 0.30–0.70)

## 2010-10-26 LAB — TSH: TSH: 1.439 u[IU]/mL (ref 0.350–4.500)

## 2010-10-26 LAB — MRSA PCR SCREENING: MRSA by PCR: NEGATIVE

## 2010-10-26 LAB — LIPASE, BLOOD: Lipase: 30 U/L (ref 11–59)

## 2010-10-27 NOTE — Letter (Signed)
Summary: [facility]   Imported By: Marylou Mccoy 10/20/2010 15:39:12  _____________________________________________________________________  External Attachment:    Type:   Image     Comment:   External Document

## 2010-10-27 NOTE — Progress Notes (Signed)
Summary: pt rtn call  Phone Note Outgoing Call   Call placed by: Scherrie Bateman, LPN,  October 20, 2010 10:37 AM Call placed to: Patient Summary of Call: LM FOR PT TO CALL BACK  NEEDING TO INFORM PT TOHOLD FUROSEMIDE AM OF DEVICE UPGRADE. Initial call taken by: Scherrie Bateman, LPN,  October 20, 2010 10:38 AM  Follow-up for Phone Call        pt rtn call and they will be home so you can rtn call Omer Jack  October 20, 2010 2:02 PM   Additional Follow-up for Phone Call Additional follow up Details #1::        Phone call completed PT'S WIFE AWARE TO HAVE PT HOLD FURSOEMIDE AM OF PROCEDURE ALSO WIFE CANNOT LOCATE INSTRUCTION SHEET REVIEWED INSTRUCTIONS  OVER PHONE AND MAILED COPY AS WELL. Additional Follow-up by: Scherrie Bateman, LPN,  October 21, 2010 11:11 AM

## 2010-10-29 ENCOUNTER — Inpatient Hospital Stay (HOSPITAL_COMMUNITY)
Admission: RE | Admit: 2010-10-29 | Discharge: 2010-10-30 | DRG: 243 | Disposition: A | Discharge status: Home / Self Care | Payer: Medicare Other | Source: Ambulatory Visit | Attending: Internal Medicine | Admitting: Internal Medicine

## 2010-10-29 DIAGNOSIS — Z951 Presence of aortocoronary bypass graft: Secondary | ICD-10-CM

## 2010-10-29 DIAGNOSIS — G4730 Sleep apnea, unspecified: Secondary | ICD-10-CM | POA: Diagnosis present

## 2010-10-29 DIAGNOSIS — I5022 Chronic systolic (congestive) heart failure: Secondary | ICD-10-CM | POA: Diagnosis present

## 2010-10-29 DIAGNOSIS — I2510 Atherosclerotic heart disease of native coronary artery without angina pectoris: Secondary | ICD-10-CM | POA: Diagnosis present

## 2010-10-29 DIAGNOSIS — E669 Obesity, unspecified: Secondary | ICD-10-CM | POA: Diagnosis present

## 2010-10-29 DIAGNOSIS — N183 Chronic kidney disease, stage 3 unspecified: Secondary | ICD-10-CM | POA: Diagnosis present

## 2010-10-29 DIAGNOSIS — E119 Type 2 diabetes mellitus without complications: Secondary | ICD-10-CM | POA: Diagnosis present

## 2010-10-29 DIAGNOSIS — I2589 Other forms of chronic ischemic heart disease: Principal | ICD-10-CM | POA: Diagnosis present

## 2010-10-29 DIAGNOSIS — I509 Heart failure, unspecified: Secondary | ICD-10-CM | POA: Diagnosis present

## 2010-10-29 DIAGNOSIS — Z86711 Personal history of pulmonary embolism: Secondary | ICD-10-CM

## 2010-10-29 DIAGNOSIS — E785 Hyperlipidemia, unspecified: Secondary | ICD-10-CM | POA: Diagnosis present

## 2010-10-29 DIAGNOSIS — I442 Atrioventricular block, complete: Secondary | ICD-10-CM | POA: Diagnosis present

## 2010-10-29 DIAGNOSIS — I129 Hypertensive chronic kidney disease with stage 1 through stage 4 chronic kidney disease, or unspecified chronic kidney disease: Secondary | ICD-10-CM | POA: Diagnosis present

## 2010-10-29 DIAGNOSIS — I059 Rheumatic mitral valve disease, unspecified: Secondary | ICD-10-CM | POA: Diagnosis present

## 2010-10-29 LAB — COMPREHENSIVE METABOLIC PANEL
ALT: 21 U/L (ref 0–53)
AST: 31 U/L (ref 0–37)
Alkaline Phosphatase: 27 U/L — ABNORMAL LOW (ref 39–117)
CO2: 28 mEq/L (ref 19–32)
Calcium: 9.7 mg/dL (ref 8.4–10.5)
Chloride: 104 mEq/L (ref 96–112)
GFR calc Af Amer: 52 mL/min — ABNORMAL LOW (ref >60)
GFR calc non Af Amer: 43 mL/min — ABNORMAL LOW (ref >60)
Potassium: 3.9 mEq/L (ref 3.5–5.1)
Sodium: 141 mEq/L (ref 135–145)
Total Bilirubin: 1.8 mg/dL — ABNORMAL HIGH (ref 0.3–1.2)

## 2010-10-29 LAB — DIFFERENTIAL
Basophils Relative: 1 % (ref 0–1)
Eosinophils Absolute: 0.1 10*3/uL (ref 0.0–0.7)
Eosinophils Relative: 2 % (ref 0–5)
Lymphs Abs: 1.2 10*3/uL (ref 0.7–4.0)

## 2010-10-29 LAB — CBC
Hemoglobin: 12.5 g/dL — ABNORMAL LOW (ref 13.0–17.0)
RBC: 3.73 MIL/uL — ABNORMAL LOW (ref 4.22–5.81)
WBC: 6.4 10*3/uL (ref 4.0–10.5)

## 2010-10-29 LAB — TYPE AND SCREEN

## 2010-10-29 LAB — PROTIME-INR
INR: 1.92 — ABNORMAL HIGH (ref 0.00–1.49)
Prothrombin Time: 15.7 seconds — ABNORMAL HIGH (ref 11.6–15.2)

## 2010-10-29 LAB — GLUCOSE, CAPILLARY: Glucose-Capillary: 241 mg/dL — ABNORMAL HIGH (ref 70–99)

## 2010-10-30 ENCOUNTER — Observation Stay (HOSPITAL_COMMUNITY): Payer: Medicare Other

## 2010-10-30 DIAGNOSIS — I442 Atrioventricular block, complete: Secondary | ICD-10-CM

## 2010-10-30 LAB — PROTIME-INR
INR: 1.91 — ABNORMAL HIGH (ref 0.00–1.49)
Prothrombin Time: 22 seconds — ABNORMAL HIGH (ref 11.6–15.2)

## 2010-10-30 LAB — GLUCOSE, CAPILLARY: Glucose-Capillary: 202 mg/dL — ABNORMAL HIGH (ref 70–99)

## 2010-10-31 LAB — GLUCOSE, CAPILLARY
Glucose-Capillary: 131 mg/dL — ABNORMAL HIGH (ref 70–99)
Glucose-Capillary: 131 mg/dL — ABNORMAL HIGH (ref 70–99)
Glucose-Capillary: 133 mg/dL — ABNORMAL HIGH (ref 70–99)
Glucose-Capillary: 133 mg/dL — ABNORMAL HIGH (ref 70–99)
Glucose-Capillary: 135 mg/dL — ABNORMAL HIGH (ref 70–99)
Glucose-Capillary: 136 mg/dL — ABNORMAL HIGH (ref 70–99)
Glucose-Capillary: 138 mg/dL — ABNORMAL HIGH (ref 70–99)
Glucose-Capillary: 138 mg/dL — ABNORMAL HIGH (ref 70–99)
Glucose-Capillary: 140 mg/dL — ABNORMAL HIGH (ref 70–99)
Glucose-Capillary: 140 mg/dL — ABNORMAL HIGH (ref 70–99)
Glucose-Capillary: 141 mg/dL — ABNORMAL HIGH (ref 70–99)
Glucose-Capillary: 142 mg/dL — ABNORMAL HIGH (ref 70–99)
Glucose-Capillary: 144 mg/dL — ABNORMAL HIGH (ref 70–99)
Glucose-Capillary: 148 mg/dL — ABNORMAL HIGH (ref 70–99)
Glucose-Capillary: 149 mg/dL — ABNORMAL HIGH (ref 70–99)
Glucose-Capillary: 149 mg/dL — ABNORMAL HIGH (ref 70–99)
Glucose-Capillary: 150 mg/dL — ABNORMAL HIGH (ref 70–99)
Glucose-Capillary: 152 mg/dL — ABNORMAL HIGH (ref 70–99)
Glucose-Capillary: 160 mg/dL — ABNORMAL HIGH (ref 70–99)
Glucose-Capillary: 166 mg/dL — ABNORMAL HIGH (ref 70–99)
Glucose-Capillary: 166 mg/dL — ABNORMAL HIGH (ref 70–99)
Glucose-Capillary: 172 mg/dL — ABNORMAL HIGH (ref 70–99)
Glucose-Capillary: 183 mg/dL — ABNORMAL HIGH (ref 70–99)

## 2010-10-31 LAB — DIFFERENTIAL
Basophils Absolute: 0 10*3/uL (ref 0.0–0.1)
Basophils Relative: 0 % (ref 0–1)
Basophils Relative: 0 % (ref 0–1)
Eosinophils Absolute: 0 10*3/uL (ref 0.0–0.7)
Eosinophils Relative: 5 % (ref 0–5)
Monocytes Absolute: 0.4 10*3/uL (ref 0.1–1.0)
Monocytes Relative: 2 % — ABNORMAL LOW (ref 3–12)
Neutrophils Relative %: 89 % — ABNORMAL HIGH (ref 43–77)

## 2010-10-31 LAB — CBC
HCT: 28.1 % — ABNORMAL LOW (ref 39.0–52.0)
HCT: 34 % — ABNORMAL LOW (ref 39.0–52.0)
HCT: 35.9 % — ABNORMAL LOW (ref 39.0–52.0)
HCT: 36 % — ABNORMAL LOW (ref 39.0–52.0)
HCT: 36.3 % — ABNORMAL LOW (ref 39.0–52.0)
HCT: 36.3 % — ABNORMAL LOW (ref 39.0–52.0)
HCT: 36.7 % — ABNORMAL LOW (ref 39.0–52.0)
Hemoglobin: 11.5 g/dL — ABNORMAL LOW (ref 13.0–17.0)
Hemoglobin: 11.8 g/dL — ABNORMAL LOW (ref 13.0–17.0)
Hemoglobin: 12.3 g/dL — ABNORMAL LOW (ref 13.0–17.0)
Hemoglobin: 12.3 g/dL — ABNORMAL LOW (ref 13.0–17.0)
Hemoglobin: 12.4 g/dL — ABNORMAL LOW (ref 13.0–17.0)
Hemoglobin: 12.6 g/dL — ABNORMAL LOW (ref 13.0–17.0)
MCH: 32.3 pg (ref 26.0–34.0)
MCH: 32.8 pg (ref 26.0–34.0)
MCH: 32.8 pg (ref 26.0–34.0)
MCH: 33 pg (ref 26.0–34.0)
MCHC: 33.9 g/dL (ref 30.0–36.0)
MCHC: 34.1 g/dL (ref 30.0–36.0)
MCHC: 34.1 g/dL (ref 30.0–36.0)
MCHC: 34.2 g/dL (ref 30.0–36.0)
MCHC: 34.3 g/dL (ref 30.0–36.0)
MCV: 95.6 fL (ref 78.0–100.0)
MCV: 96.2 fL (ref 78.0–100.0)
MCV: 96.2 fL (ref 78.0–100.0)
MCV: 96.5 fL (ref 78.0–100.0)
MCV: 96.7 fL (ref 78.0–100.0)
MCV: 96.8 fL (ref 78.0–100.0)
MCV: 96.8 fL (ref 78.0–100.0)
Platelets: 187 10*3/uL (ref 150–400)
Platelets: 247 10*3/uL (ref 150–400)
Platelets: 248 10*3/uL (ref 150–400)
Platelets: 253 10*3/uL (ref 150–400)
Platelets: 264 10*3/uL (ref 150–400)
Platelets: 279 10*3/uL (ref 150–400)
RBC: 3.51 MIL/uL — ABNORMAL LOW (ref 4.22–5.81)
RBC: 3.62 MIL/uL — ABNORMAL LOW (ref 4.22–5.81)
RBC: 3.72 MIL/uL — ABNORMAL LOW (ref 4.22–5.81)
RBC: 3.75 MIL/uL — ABNORMAL LOW (ref 4.22–5.81)
RBC: 3.76 MIL/uL — ABNORMAL LOW (ref 4.22–5.81)
RBC: 3.76 MIL/uL — ABNORMAL LOW (ref 4.22–5.81)
RBC: 3.82 MIL/uL — ABNORMAL LOW (ref 4.22–5.81)
RDW: 13.5 % (ref 11.5–15.5)
RDW: 13.6 % (ref 11.5–15.5)
RDW: 13.9 % (ref 11.5–15.5)
RDW: 13.9 % (ref 11.5–15.5)
WBC: 6 10*3/uL (ref 4.0–10.5)
WBC: 6.4 10*3/uL (ref 4.0–10.5)
WBC: 6.5 10*3/uL (ref 4.0–10.5)
WBC: 7.9 10*3/uL (ref 4.0–10.5)
WBC: 8.1 10*3/uL (ref 4.0–10.5)
WBC: 8.1 10*3/uL (ref 4.0–10.5)
WBC: 8.3 10*3/uL (ref 4.0–10.5)

## 2010-10-31 LAB — BASIC METABOLIC PANEL
BUN: 25 mg/dL — ABNORMAL HIGH (ref 6–23)
BUN: 27 mg/dL — ABNORMAL HIGH (ref 6–23)
BUN: 31 mg/dL — ABNORMAL HIGH (ref 6–23)
BUN: 35 mg/dL — ABNORMAL HIGH (ref 6–23)
BUN: 37 mg/dL — ABNORMAL HIGH (ref 6–23)
CO2: 25 mEq/L (ref 19–32)
CO2: 27 mEq/L (ref 19–32)
CO2: 31 mEq/L (ref 19–32)
CO2: 32 mEq/L (ref 19–32)
Calcium: 9.3 mg/dL (ref 8.4–10.5)
Calcium: 9.4 mg/dL (ref 8.4–10.5)
Chloride: 105 mEq/L (ref 96–112)
Chloride: 89 mEq/L — ABNORMAL LOW (ref 96–112)
Chloride: 91 mEq/L — ABNORMAL LOW (ref 96–112)
Chloride: 96 mEq/L (ref 96–112)
Chloride: 98 mEq/L (ref 96–112)
Creatinine, Ser: 1.33 mg/dL (ref 0.4–1.5)
Creatinine, Ser: 1.6 mg/dL — ABNORMAL HIGH (ref 0.4–1.5)
Creatinine, Ser: 1.63 mg/dL — ABNORMAL HIGH (ref 0.4–1.5)
Creatinine, Ser: 1.86 mg/dL — ABNORMAL HIGH (ref 0.4–1.5)
Creatinine, Ser: 1.93 mg/dL — ABNORMAL HIGH (ref 0.4–1.5)
Creatinine, Ser: 1.93 mg/dL — ABNORMAL HIGH (ref 0.4–1.5)
Creatinine, Ser: 1.94 mg/dL — ABNORMAL HIGH (ref 0.4–1.5)
GFR calc Af Amer: 40 mL/min — ABNORMAL LOW (ref >60)
GFR calc Af Amer: 40 mL/min — ABNORMAL LOW (ref >60)
GFR calc Af Amer: 40 mL/min — ABNORMAL LOW (ref >60)
GFR calc Af Amer: 49 mL/min — ABNORMAL LOW (ref >60)
GFR calc non Af Amer: 33 mL/min — ABNORMAL LOW (ref >60)
GFR calc non Af Amer: 33 mL/min — ABNORMAL LOW (ref >60)
GFR calc non Af Amer: 35 mL/min — ABNORMAL LOW (ref >60)
GFR calc non Af Amer: 51 mL/min — ABNORMAL LOW (ref >60)
Glucose, Bld: 119 mg/dL — ABNORMAL HIGH (ref 70–99)
Glucose, Bld: 137 mg/dL — ABNORMAL HIGH (ref 70–99)
Glucose, Bld: 156 mg/dL — ABNORMAL HIGH (ref 70–99)
Glucose, Bld: 163 mg/dL — ABNORMAL HIGH (ref 70–99)
Glucose, Bld: 201 mg/dL — ABNORMAL HIGH (ref 70–99)
Potassium: 3.6 mEq/L (ref 3.5–5.1)
Potassium: 3.8 mEq/L (ref 3.5–5.1)
Potassium: 3.8 mEq/L (ref 3.5–5.1)
Potassium: 3.9 mEq/L (ref 3.5–5.1)
Potassium: 4 mEq/L (ref 3.5–5.1)
Potassium: 4.4 mEq/L (ref 3.5–5.1)
Sodium: 133 mEq/L — ABNORMAL LOW (ref 135–145)
Sodium: 133 mEq/L — ABNORMAL LOW (ref 135–145)
Sodium: 137 mEq/L (ref 135–145)
Sodium: 139 mEq/L (ref 135–145)

## 2010-10-31 LAB — PROTIME-INR
INR: 1.08 (ref 0.00–1.49)
INR: 1.13 (ref 0.00–1.49)
INR: 1.13 (ref 0.00–1.49)
INR: 1.23 (ref 0.00–1.49)
Prothrombin Time: 14.4 seconds (ref 11.6–15.2)
Prothrombin Time: 15.4 seconds — ABNORMAL HIGH (ref 11.6–15.2)
Prothrombin Time: 17.3 seconds — ABNORMAL HIGH (ref 11.6–15.2)
Prothrombin Time: 18.1 seconds — ABNORMAL HIGH (ref 11.6–15.2)

## 2010-10-31 LAB — BLOOD GAS, ARTERIAL
Acid-Base Excess: 3.6 mmol/L — ABNORMAL HIGH (ref 0.0–2.0)
Drawn by: 33176
FIO2: 0.4 %
pCO2 arterial: 44.4 mmHg (ref 35.0–45.0)
pH, Arterial: 7.414 (ref 7.350–7.450)

## 2010-10-31 LAB — LIPID PANEL
HDL: 23 mg/dL — ABNORMAL LOW (ref >39)
Total CHOL/HDL Ratio: 4.6 RATIO
Triglycerides: 170 mg/dL — ABNORMAL HIGH (ref <150)
VLDL: 34 mg/dL (ref 0–40)

## 2010-10-31 LAB — CARDIAC PANEL(CRET KIN+CKTOT+MB+TROPI)
Relative Index: 2.4 (ref 0.0–2.5)
Relative Index: 4.9 — ABNORMAL HIGH (ref 0.0–2.5)
Troponin I: 8.33 ng/mL (ref 0.00–0.06)

## 2010-10-31 LAB — COMPREHENSIVE METABOLIC PANEL
ALT: 30 U/L (ref 0–53)
Alkaline Phosphatase: 29 U/L — ABNORMAL LOW (ref 39–117)
CO2: 23 mEq/L (ref 19–32)
Chloride: 103 mEq/L (ref 96–112)
GFR calc non Af Amer: 40 mL/min — ABNORMAL LOW (ref >60)
Glucose, Bld: 263 mg/dL — ABNORMAL HIGH (ref 70–99)
Potassium: 5 mEq/L (ref 3.5–5.1)
Sodium: 136 mEq/L (ref 135–145)
Total Bilirubin: 1.3 mg/dL — ABNORMAL HIGH (ref 0.3–1.2)
Total Protein: 7.1 g/dL (ref 6.0–8.3)

## 2010-10-31 LAB — EXPECTORATED SPUTUM ASSESSMENT W GRAM STAIN, RFLX TO RESP C

## 2010-10-31 LAB — BRAIN NATRIURETIC PEPTIDE
Pro B Natriuretic peptide (BNP): 1119 pg/mL — ABNORMAL HIGH (ref 0.0–100.0)
Pro B Natriuretic peptide (BNP): 438 pg/mL — ABNORMAL HIGH (ref 0.0–100.0)
Pro B Natriuretic peptide (BNP): 691 pg/mL — ABNORMAL HIGH (ref 0.0–100.0)
Pro B Natriuretic peptide (BNP): 766 pg/mL — ABNORMAL HIGH (ref 0.0–100.0)

## 2010-10-31 LAB — HEPARIN LEVEL (UNFRACTIONATED)
Heparin Unfractionated: 0.11 IU/mL — ABNORMAL LOW (ref 0.30–0.70)
Heparin Unfractionated: 0.42 IU/mL (ref 0.30–0.70)
Heparin Unfractionated: 0.43 IU/mL (ref 0.30–0.70)
Heparin Unfractionated: 0.57 IU/mL (ref 0.30–0.70)
Heparin Unfractionated: 0.89 IU/mL — ABNORMAL HIGH (ref 0.30–0.70)

## 2010-10-31 LAB — CULTURE, RESPIRATORY W GRAM STAIN

## 2010-10-31 LAB — TSH: TSH: 0.527 u[IU]/mL (ref 0.350–4.500)

## 2010-10-31 LAB — HEMOGLOBIN A1C: Mean Plasma Glucose: 160 mg/dL — ABNORMAL HIGH (ref <117)

## 2010-11-05 NOTE — Op Note (Signed)
  NAME:  Samuel Christensen, Samuel Christensen NO.:  [id number]  MEDICAL RECORD NO.:  [id number]           PATIENT TYPE:  O  LOCATION:  6525                         FACILITY:  [facility]  PHYSICIAN:  Duke Salvia, MD, FACCDATE OF BIRTH:  1925-02-28  DATE OF PROCEDURE: DATE OF DISCHARGE:                              OPERATIVE REPORT   PREOPERATIVE DIAGNOSES:  Congestive heart failure, previously implanted pacemaker with complete heart block; mitral regurgitation, ischemic heart disease, and device dependence.  POSTOPERATIVE DIAGNOSES:  Congestive heart failure, previously implanted pacemaker with complete heart block; mitral regurgitation, ischemic heart disease, and device dependence.  PROCEDURE:  Explantation of a previously implanted device, pocket revision, implantation of a new device with left ventricular lead placement.  DESCRIPTION OF PROCEDURE:  Following obtaining informed consent, the patient was brought to the electrophysiology laboratory and placed on the fluoroscopic table in the supine position.  After routine prep and drape, lidocaine was infiltrated near the previous incision, and a small 2-cm incision was made and carried down to the prepectoral fascia, not exposing the device pocket.  Venous access was obtained without difficulty.  A 9.5 French sheath was placed and Medtronic MB2, and a sinus cannulation catheter was placed and this allowed for ready access of the coronary sinus.  A venogram demonstrated no lateral veins.  We then took a whisper wire and explored blindly and were able to find a high anterolateral branch through which we placed a Medtronic 4396, 88- cm lead which went to the junction of the mid and proximal thirds.  In this location, in a bipolar configuration, there was no capture; however, in a unipolar configuration, pace tip to pocket, pacing threshold was about 3 volts.  The lead was secured in this position.  We then exposed the pocket, freed  up the previously implanted lead.  The previously inserted ventricular lead 4076 has serial number [id number], and there was no intrinsic ventricular rhythm.  The impedance was 559, the threshold was less than 1 volt at 0.5.  The atrial lead was a 4136, serial number [id number].  It was not tested because of my forgetfulness. The leads were then attached to Ace Contak Renewal, TR CRT-P, model H120, serial number [id number].  P-synchronous pacing and then BiV pacing were identified.  The pocket was copiously irrigated with antibiotic- containing saline solution.  Hemostasis was assured.  The leads and the pulse generator were placed in the pocket, secured to the prepectoral fascia.  The wound was closed in 3 layers in normal fashion.  I should note also that the pocket had to be revised to allow for placing of the larger device.     Duke Salvia, MD, [facility]     SCK/MEDQ  D:  10/29/2010  T:  10/30/2010  Job:  [id number]  Electronically Signed by Sherryl Manges MD [facility] on 11/05/2010 08:35:53 AM

## 2010-11-06 ENCOUNTER — Other Ambulatory Visit: Payer: Medicare Other

## 2010-11-12 ENCOUNTER — Other Ambulatory Visit: Payer: Medicare Other | Admitting: *Deleted

## 2010-11-12 ENCOUNTER — Ambulatory Visit (INDEPENDENT_AMBULATORY_CARE_PROVIDER_SITE_OTHER): Payer: Medicare Other | Admitting: *Deleted

## 2010-11-12 DIAGNOSIS — I428 Other cardiomyopathies: Secondary | ICD-10-CM

## 2010-11-30 NOTE — Discharge Summary (Signed)
NAME:  Samuel Christensen, Samuel Christensen NO.:  [id number]  MEDICAL RECORD NO.:  [id number]           PATIENT TYPE:  I  LOCATION:  6525                         FACILITY:  [facility]  PHYSICIAN:  Hillis Range, MD       DATE OF BIRTH:  01/25/1925  DATE OF ADMISSION:  10/29/2010 DATE OF DISCHARGE:  10/30/2010                              DISCHARGE SUMMARY   PRIMARY CARDIOLOGIST:  Gerlene Burdock A. Alanda Amass, MD  PRIMARY ELECTROPHYSIOLOGIST:  Duke Salvia, MD, [facility]  DISCHARGE DIAGNOSIS:  Chronic systolic congestive heart failure/ischemic cardiomyopathy.  SECONDARY DIAGNOSES: 1. Coronary artery disease, status post prior coronary artery bypass     grafting in 1991. 2. Complete heart block with history of bradycardic arrest, status     post permanent pacemaker with upgrade to biventricular pacemaker     this admission. 3. Stage III chronic kidney disease. 4. Hypertension. 5. Hyperlipidemia. 6. Diabetes mellitus. 7. Sleep apnea. 8. Obesity. 9. Pulmonary embolism.  ALLERGIES:  No known drug allergies.  PROCEDURES:  Successful explant of prior permanent pacemaker and placement of a new Medtronic left ventricular lead (4396) and placement of a Guidant Contak Renewal TR CRT-P biventricular permanent pacemaker, model H120, serial number [id number].  HISTORY OF PRESENT ILLNESS:  This is an 77 year old male with prior history of ischemic cardiomyopathy, coronary artery disease, and complete heart block status post pacemaker placement.  The patient also has a history of pulmonary embolism, on chronic Coumadin.  The patient has chronic class III heart failure symptoms and was recently seen in clinic by Dr. Graciela Husbands on October 13, 2010.  After some discussion, decision was made to upgrade his pacemaker to a biventricular pacemaker.  HOSPITAL COURSE:  The patient presented to the Electrophysiology Lab on October 29, 2010, and underwent successful placement of a new LV lead with explant of the  previously placed pacemaker and implantation of a Guidant Contak Renewal biventricular pacemaker.  The patient tolerated this procedure well and postprocedure chest x-ray shows no evidence of pneumothorax.  The patient will be discharged home this afternoon in good condition following completion of his antibiotics.  DISCHARGE LABORATORY DATA:  INR 1.91.  DISPOSITION:  The patient will be discharged home today in good condition.  FOLLOWUP PLANS AND APPOINTMENTS:  The patient will follow at [facility] for routine cardiology followup as well as Coumadin Clinic.  The patient will follow up with [facility] on November 12, 2010, at 10:30 a.m. for wound check interrogation.  He also will have a basic metabolic panel that day.  The patient will follow up will Dr. Graciela Husbands on February 02, 2011, at 11 a.m.  DISCHARGE MEDICATIONS: 1. Acetaminophen 325 mg 2 tablets daily p.r.n. 2. Bystolic 5 mg daily. 3. Centrum Silver 1 tablet daily. 4. Fish oil 1000 mg 2 capsules daily. 5. Imdur 30 mg daily. 6. Jantoven 5 mg 1/2 tablet 3 days a week, 1 tablet 4 days a week. 7. Lasix 40 mg daily. 8. Lexapro 20 mg daily. 9. Metoprolol tartrate 25 mg daily. 10.Niacinamide 500 mg daily. 11.Nitroglycerin 0.4 mg sublingual p.r.n. chest pain. 12.Potassium chloride 10 mEq  daily. 13.Proventil inhaler 2 puffs q.6 h. p.r.n. 14.TriCor 145 mg daily.  We have not changed any of his previous home medications.  OUTSTANDING LABORATORY DATA AND STUDIES:  Followup basic metabolic panel on November 12, 2010, at [facility].  DURATION OF DISCHARGE ENCOUNTER:  35 minutes including physician time.     Nicolasa Ducking, ANP   ______________________________ Hillis Range, MD    CB/MEDQ  D:  10/30/2010  T:  10/31/2010  Job:  [id number]  cc:   Gerlene Burdock A. Alanda Amass, M.D.  Electronically Signed by Nicolasa Ducking ANP on 11/02/2010 12:06:42 PM Electronically Signed by Hillis Range MD on  11/30/2010 09:19:23 AM

## 2010-12-02 ENCOUNTER — Encounter: Payer: Self-pay | Admitting: Internal Medicine

## 2010-12-22 ENCOUNTER — Ambulatory Visit
Admission: RE | Admit: 2010-12-22 | Discharge: 2010-12-22 | Disposition: A | Discharge status: Home / Self Care | Payer: Medicare Other | Source: Ambulatory Visit | Attending: Cardiovascular Disease | Admitting: Cardiovascular Disease

## 2010-12-22 ENCOUNTER — Other Ambulatory Visit: Payer: Self-pay | Admitting: Cardiovascular Disease

## 2010-12-22 DIAGNOSIS — R0602 Shortness of breath: Secondary | ICD-10-CM

## 2010-12-29 NOTE — Consult Note (Signed)
NAME:  Samuel Christensen, DHONDT NO.:  [id number]   MEDICAL RECORD NO.:  [id number]          PATIENT TYPE:  INP   LOCATION:  1825                         FACILITY:  [facility]   PHYSICIAN:  Coralyn Helling, MD        DATE OF BIRTH:  1925/06/24   DATE OF CONSULTATION:  08/10/2007  DATE OF DISCHARGE:  08/10/2007                                 CONSULTATION   REFERRING PHYSICIAN:  Vonna Kotyk R. Jacinto Halim, MD.   REASON FOR CONSULTATION:  Ventilatory management.   Mr. Gortney is an 77 year old male who was received in transfer from  [facility] after having a bradycardic arrest. His wife says that  during the middle of the night he started to nudge her, and when she  woke up she found that he was having seizure-like activity with shaking.  He then recovered from this and had an episode of emesis. She got him up  to go to the bathroom, and, in the process of this, he passed out and  appeared to have additional seizure activity.  She then called her  daughter and son-in-law, who work in the [employer]. He was then  brought to [facility] when he was found to have severe  bradycardia.  He was intubated and stabilized as much as possible.  He  was then transferred to [facility] for further therapy at which  time he had received a transvenous pacemaker insertion with improvement  in his heart rate and his hemodynamics.   PAST MEDICAL HISTORY:  Significant for:  1. Coronary disease status post coronary bypass grafting.  2. Pulmonary embolism and DVT on chronic Coumadin therapy.  3. Obstructive sleep apnea.  4. His peripheral vascular disease with renal artery stenosis status      post stenting.  5. Hypertension.  6. Hyperlipidemia.  7. Gastroesophageal reflux disease.  8. Chronic renal insufficiency.  I am not sure what his baseline      creatinine is.  9. Obesity.   OUTPATIENT MEDICATIONS:  Include Coumadin, torsemide, Norvasc, Lexapro,  fish oil, Imdur, Zestril, Toprol  XL, Vytorin, Tricor, Plavix, Nexium,  niacin and a multivitamin.   FAMILY HISTORY:  Noncontributory.   SOCIAL HISTORY:  He is married.  No history of alcohol or tobacco use.   ALLERGIES:  MOTRIN.   REVIEW OF SYSTEMS:  Unable to obtain.   PHYSICAL EXAMINATION:  GENERAL:  He is seen in the emergency room.  He  is intubated and sedated.  VITAL SIGNS:  Heart rate is 56 and completely paced. Blood pressure is  146/82, oxygen saturation 97% on 40/% FIO2, respiratory rate 14.  HEENT:  Pupils reactive.  NECK:  His has an endotracheal tube in place.  He has a short, stocky  neck.  HEART:  S1-S2.  CHEST:  Shows coarse breath sounds but no wheezing or rales.  ABDOMEN:  Obese, soft, nontender.  EXTREMITIES:  There was minimal ankle edema.   Arterial blood gas showed pH of 7.34, pCO2 of 39.6, pO2 of 116. Sodium  144, potassium 4.8, chloride 102, CO2 19, creatinine 2.6, BUN 22,  glucose 244.  WBC 11.1, hemoglobin 13, hematocrit 38, platelet count  192.   IMPRESSION:  1. Acute respiratory failure in the setting of a bradycardic arrest.      I would adjust his ventilatory settings after following up on his      chest x-ray and his blood gas.  I will also titrate his oxygenation      to maintain oxygen saturation greater than 92%.  I would continue      him on Ventolin and Versed as needed for sedation while he is on      the ventilator.  2. Bradycardic arrest.  He has a transvenous pacemaker and is      scheduled to have a permanent pacemaker inserted in the near future      per cardiology.  3. Hyperglycemia.  He does not carry a diagnosis of diabetes, and this      could be related to a stress response.  I will start him on a      sliding scale insulin for the time being.  4. History of deep vein thrombosis and pulmonary embolus.  He will      need to be on long-term anticoagulation after he has the pacemaker      inserted. He is currently on heparin.  5. Obstructive sleep apnea.  We  will need to be careful about      maintaining his upper airway at the time of extubation.  6. Hypertension.  His blood pressure appears to be stable at the      present time.  7. Chronic renal insufficiency.  I will need to review his previous      medical records as to what his baseline creatinine is.  8. Gastroesophageal reflux disease.  I will continue him on Protonix      intravenously.  9. Deep vein thrombosis prophylaxis.  He is currently on a heparin      infusion.      Coralyn Helling, MD  Electronically Signed     VS/MEDQ  D:  08/10/2007  T:  08/10/2007  Job:  [id number]

## 2010-12-29 NOTE — Op Note (Signed)
NAME:  Samuel Christensen, Samuel Christensen NO.:  [id number]   MEDICAL RECORD NO.:  [id number]          PATIENT TYPE:  INP   LOCATION:  2908                         FACILITY:  [facility]   PHYSICIAN:  Doylene Canning. Ladona Ridgel, MD    DATE OF BIRTH:  September 30, 1924   DATE OF PROCEDURE:  08/11/2007  DATE OF DISCHARGE:                               OPERATIVE REPORT   PROCEDURE PERFORMED:  Permanent pacemaker insertion.   INDICATION:  Complete heart block.   I. OBSTRUCTION:  The patient is a very pleasant 77 year old male with  known coronary disease and preserved LV function who was admitted to the  hospital in transfer from [facility] with syncope and  complete heart block.  His ventricular escape was initially in the 20s.  He had a temporary transvenous pacemaker implanted there; but in the  meantime, developed some non-capture and had temporary lead revision.  He is now referred for permanent pacemaker implantation.   Of note, the patient's initial presentation was complicated by evidence  of aspiration according to his wife.  He now has a low-grade fever.  Because of concerns about leaving a temporary transvenous system in for  several days; he is now referred for permanent pacemaker implantation.   II. PROCEDURE:  After informed consent was obtained, the patient was  taken to the diagnostic EP lab in the fasting state.  After the usual  preparation and draping, intravenous fentanyl and metaxalone was  continued; 30 mL of lidocaine was infiltrated into the left  infraclavicular region.  A 5 cm incision was carried out over this  region.  Electrocautery utilized to dissect down to the fascial plane.  After several attempts to puncture the left subclavian vein were  unsuccessful, a left upper extremity venography was carried out  demonstrating that the subclavian vein was patent, but was shifted  somewhat superiorly more than usual.  After this was accomplished, the  vein was punctured  x2; and the Guidant Dextra model 4137 active fixation  pacing lead, serial number [id number] was advanced into the right  ventricle, and the Guidant Dextra model 4136 active fixation atrial  lead, serial number [id number] was advanced to the right atrium.  Mapping  was carried out in the right ventricle, and final site on the RV septum.  The R-waves measured 11.9 mV (paced) and the impedance was 880 ohms with  the lead actively fixed.  Threshold 0.6 V at 0.5 milliseconds.  A nice  injury current was visualized.   With the ventricular lead in satisfactory position, attention then  turned to placement of the atrial lead which was placed in the anterior  portion of the right atrium.  There, P-waves initially measured in the  mid-2's, but after the lead actively was fixed, the P-waves measured in  the 1.5 range.  Despite this, there was large injury current, and the  threshold 0.6 V at 0.5 msec.  The pacing impedance was 528 ohms and  stable.  The 10 V  pacing both in the atrium and the ventricle did not  stimulate the diaphragm.  With the leads in  satisfactory position, they  were secured to the subpectoralis fascia with a figure-of-eight silk  suture.  The sewing sleeve was also secured with a silk suture.   Electrocautery was utilized to make a subcutaneous pocket.  Kanamycin  irrigation was utilized to irrigate the pocket, and electrocautery was  utilized to assure hemostasis.  The Guidant Insignia 1+ DR dual-chamber  pacemaker serial number [id number] was connected to the atrial and  ventricular leads, and placed back in the subcutaneous pocket.  Generator was secured with a silk suture.   At this point, the pocket was irrigated with kanamycin.  The incision  was closed with a layer of 2-0 Vicryl followed by layer of 3-0 Vicryl.  Benzoin was pained on the skin, and Steri-Strips were applied; and a  pressure dressing was placed; and the patient was returned to his room  in satisfactory  condition.   III. COMPLICATIONS:  There were no immediate procedure complications.   IV. RESULTS:  This demonstrates successful implantation of a Guidant  dual-chamber pacemaker in a patient with symptomatic complete heart  block.      Doylene Canning. Ladona Ridgel, MD  Electronically Signed     GWT/MEDQ  D:  08/11/2007  T:  08/11/2007  Job:  [id number]   cc:   Gerlene Burdock A. Alanda Amass, M.D.

## 2010-12-29 NOTE — Op Note (Signed)
NAME:  Samuel Christensen, Samuel Christensen NO.:  [id number]   MEDICAL RECORD NO.:  [id number]          PATIENT TYPE:  INP   LOCATION:  2928                         FACILITY:  [facility]   PHYSICIAN:  Duke Salvia, MD, FACCDATE OF BIRTH:  1924-11-21   DATE OF PROCEDURE:  08/18/2007  DATE OF DISCHARGE:                               OPERATIVE REPORT   PREOPERATIVE DIAGNOSIS:  High RV pacing threshold   POSTOPERATIVE DIAGNOSIS:  High RV pacing threshold.   PROCEDURE:  Contrast venogram, implantation of a new lead, explantation  of a previously implanted lead.   Following obtaining informed consent, the patient was brought to the  electrophysiology laboratory and placed on the fluoroscopic table in a  supine position. After routine prep and drape, lidocaine was infiltrated  along the line of the previous incision.  The incision was opened and  carried down to the layer of the device pocket and all the suture  remnants were removed.   At this point, following the contrast venogram, the vein was cannulated  without difficulty.  A Wholey wire was needed to pass an area of  stenosis and then a new 7-French sheath was placed through which was  then passed a Medtronic 5076 active fixation ventricular lead, model  number [id number].  Under fluoroscopic guidance, it was manipulated to  the right ventricular septum where the paced R wave was about 8, the  impedance was normal, and the threshold was about 1.3 volts.  The  current of injury was quite brisk.  This lead was secured to the  prepectoral fascia and then the previously implanted lead was removed  following the defixation.  The pocket was copiously irrigated with  antibiotic containing saline solution.  The new lead was attached to the  previously implanted Guidant Insignia I pulse generator, model 1297,  serial number [id number], and P-synchronous pacing was identified.  The  wound was irrigated.  Surgicel was used at the cephalad aspect of  the  wound.  The wound was then closed in three layers in a normal fashion.  The wound was washed, dried and a Benzoin Steri-Strip dressing was  applied.  Needle counts, sponge counts, and instrument counts were  correct at the end of the procedure according to the staff.  The patient  tolerated the procedure without apparent complication.      Duke Salvia, MD, [facility]  Electronically Signed     SCK/MEDQ  D:  08/18/2007  T:  08/18/2007  Job:  [id number]   cc:   Gerlene Burdock A. Alanda Amass, M.D.  Doralee Albino. Carola Frost, M.D.

## 2010-12-29 NOTE — Discharge Summary (Signed)
NAME:  Samuel Christensen, Samuel Christensen NO.:  [id number]   MEDICAL RECORD NO.:  [id number]          PATIENT TYPE:  INP   LOCATION:  2027                         FACILITY:  [facility]   PHYSICIAN:  Cristy Hilts. Jacinto Halim, MD       DATE OF BIRTH:  [birth year]   DATE OF ADMISSION:  08/10/2007  DATE OF DISCHARGE:  08/20/2007                               DISCHARGE SUMMARY   STAT PRIORITY DISCHARGE SUMMARY   DISCHARGE DIAGNOSES:  1. Cardiac and respiratory arrest secondary to complete heart block.      a.     Placement of permanent transvenous pacemaker, a Guidant dual-       chamber, secondary to symptomatic complete heart block on August 11, 2007, and revision of the right ventricular lead due to a non-       capture on August 18, 2007, by Dr. Lewayne Bunting.      b.     Ventilator-dependent respiratory failure managed by Critical       Care Medicine on admission that resolved.  2. Asthma, which was stable.  3. Obstructive sleep apnea with continuous positive airway pressure.  4. Aspiration pneumonia treated with antibiotics.  5. Questionable aspiration, but with dentures in place the patient had      no aspiration with foods.  6. Coronary disease with history of bypass grafting.  7. The patient had blisters on his right lateral back and a rash on      his lower back.  The blisters were secondary to external pads; he      is ALLERGIC TO ADHESIVE with his arrest, and then the rash      resolved.  8. History of deep venous thrombosis and pulmonary embolism on      Coumadin, and he was discharged with a therapeutic INR.  9. Diabetes mellitus 2 treated with diet.  During the hospitalization,      Dr. Felipa Eth and his group saw the patient, and Dr. Waynard Edwards saw him at      discharge and felt that he would hold off on medication for      diabetes and he would give the patient a Glucometer to do Accu-      Cheks from his office and keep a record, and he would follow up      with Dr. Felipa Eth as an  outpatient.  10.Hypertension controlled.  11.Peripheral vascular disease with renal artery stenosis status post      stenting.  12.Hyperlipidemia.  13.Gastroesophageal reflux disease.  14.Obesity.  15.Acute renal failure resolved.   FAMILY HISTORY, SOCIAL HISTORY, REVIEW OF SYSTEMS:  See H&P.   DISCHARGE MEDICATIONS:  1. Coumadin 5 mg daily.  2. Lexapro 20 mg daily.  3. Multivitamin daily.  4. Nexium 40 mg daily.  5. Tricor-HS daily.  6. Vytorin 10/20 daily.  7. Niacin 500 mg twice a day.  8. Lopressor 25 mg three times a day.  9. Avelox 400 mg one daily for one day only on August 21, 2007.  10.Keflex 250 mg before meals and at bedtime  starting on August 24, 2007.  11.Have protime checked on Tuesday, the office will call.  12.Stop Lisinopril, Torsemide, Norvasc, Imdur, Toprol-XL, Plavix, and      fish oil for now.  13.Go by [facility] for Accu-Chek machine and record      blood checks twice a day before breakfast and at supper and bring      to the appointment.  14.Follow up with Dr. Alanda Amass by the end of next week, the office      will call with date and time.  15.Call Dr. Felipa Eth for an appointment.  16.Continue your CPAP at home.  17.A low sodium, heart healthy, diabetic diet.  18.Follow pacemaker instructions on arm movement and wearing sling and      what to look for infection.  19.The patient was instructed not to throw away his medications as      they may be restarted once he is at home on a more stable regular      diet.   HISTORY OF PRESENT ILLNESS:  An 77 year old, white, married, male  patient of Dr. Alanda Amass was admitted to [facility] on August 10, 2007, after waking vomiting at home, being difficult to arouse, and  syncope.  His wife got him to a chair, EMS was called, and they went to  [facility].  Heart rate was in the 20s and he was  transferred to [facility] emergently.  He initially had a non-paced temporary  pacer  and on arrival to [facility] an external pacer with capture.  He had no  prior chest pain or shortness of breath before the onset of the syncope.  He was admitted to the CCU and Dr. Jacinto Halim repositioned the temporary  pacemaker with 100% capture at 0.8 MA's, kept the rate of the pacer at  80, MA's were up to 5, and it was done under fluoroscopy.  He was placed  on antibiotic prophylaxis, and a Pulmonary Care consult if patient was  intubated was done for ventilator management.   PAST MEDICAL HISTORY:  1. Coronary disease with history of bypass grafting in 1994 with non-      grafts, and saphenous vein graft to the [facility] marginals 1 and 2,      saphenous vein graft to OM-1, 2, and 3 sequentially, saphenous vein      graft to the diagonal, LIMA to the LAD, saphenous vein graft to the      PDA, saphenous vein graft to the PLA.  He has since had stents in      2000, 2004, and 2006.  2. History of pulmonary embolus and DVT approximately two years      previously and was on chronic Coumadin.  3. Peripheral vascular disease with stenting of the left renal artery      in 2005.  4. History of heart failure in 2007, EF was 55%, with mid      anterolateral hypokinesis by a left cath in 2006.  5. Hypertension.  6. Hyperlipidemia.  7. Sleep apnea with CPAP.  8. Reflux disease.  9. Chronic renal insufficiency.  10.Morbid obesity.   ALLERGIES:  MOTRIN.   LABORATORY DATA:  Admitting labs:  Hemoglobin 13.8, hematocrit 40, WBC  10.9, platelets 190, neutrophils 73, lymphs 15, monos 10, eos 0, baso 1.  Hemoglobin was essentially stable and at discharge it was 11.6,  hematocrit of 33.8, white count was 8.0 discharge, and platelets 235.  At one point,  they did drop to 133.   Protime on admission 17.7, INR of 1.4, PTT 22, and at discharge, protime  24.4 and INR of 2.1.   Chemistry:  Sodium 144, potassium 4.4, chloride 105, CO2 25, glucose  159, BUN 25, creatinine 3.14, and this was on admission, and at   discharge he had normalized to a sodium of 139, potassium 4.1, chloride  105, CO2 28, glucose 135, BUN 25, creatinine 1.6; he had always been  renal insufficient, and his GFR was 19, and at discharge it did come up  to 42.   Total protein 6.6, albumin 3.9, AST 55, this was on admission, ALT 33,  alkaline-phos was 52, total-bili 0.7, mag 1.6, glycohemoglobin 6.6.  Cardiac enzymes essentially negative, CK has ranged 149, 203, 453, MB is  3.3, 3.2, and 4.3, relative index was negative, and troponin-I 0.48 with  the peak 0.34 and 0.24.   TSH 2.209.   Blood cultures were negative x a total of 4.   Urine culture was negative.   RADIOLOGY:  Chest x-ray at [facility]:  ET tube approximately 6  cm above the carina, NG tube advanced to decompress stomach, changes of  bypass grafting, moderately enlarged cardiac silhouette stable, mild  pulmonary vascular congestion, and perihilar interstitial edema or  infiltrate, no definite effusion, I-J pacing, no pneumothorax.  Chest x-  rays at [facility]:  December 25th, fluoroscopy was used.  December 27th,  cardiomegaly and bibasilar atelectasis, and August 17, 2007, unchanged  cardiac pacemaker and bypass grafting and sternotomy, no air space  disease with scattered areas of scar atelectasis, small bilateral  pleural effusions.  August 19, 2007, stable cardiomegaly, no acute  findings.  The dual-lead transvenous pacemaker remains in appropriate  position and no evidence of pneumothorax or pleural effusion.   EKGs:  Complete heart block on admission at [facility], and pacing  attempts without capture at [facility].  He was in a sinus rhythm and first  degree AV block with ventricular pacing.   HOSPITAL COURSE:  The patient was admitted by Dr. Jacinto Halim secondary to a  cardiopulmonary arrest with complete heart block, heart rates in the  20s, and syncope at home.  He had a temporary pacer placed at [facility], but it was not capturing.  He was sent  on external pacer to  [facility] where he had repositioning of his temporary pacer.  He had been  intubated at [facility], Critical Care Medicine followed it here and  managed his ventilator for [place].  He was felt to have aspiration pneumonia  treated with antibiotics.  He was evaluated by Dr. Lewayne Bunting for  permanent pacemaker, which was placed on August 11, 2007, without  complications.  He continued to improve, he was extubated August 13, 2007, and continued to do well, and then eventually transferred to the  floor.  He was on Lovenox for a history of PE and DVT, and Coumadin was  started.  By August 18, 2007, he was stable and actually ready to go  home, but then he had a non-capture with his pacemaker so he underwent  revision of that lead by Dr. Ladona Ridgel and was admitted to the TCU  overnight and by the next morning ready to transfer back to a 2000  telemetry bed.  He was ambulated, he was doing well, and we were using  the CPAP at night for him, and by August 20, 2007,  he was stable and ready for discharge home.  His  INR was therapeutic and  he would follow up in one week with Dr. Alanda Amass for a wound site  check.  He will also have his INR evaluated prior to that visit.   The patient was seen and examined by Dr. Elsie Lincoln and felt ready for  discharge.      Darcella Gasman. [place], N.P.      Cristy Hilts. Jacinto Halim, MD  Electronically Signed    LRI/MEDQ  D:  08/23/2007  T:  08/23/2007  Job:  [id number]   cc:   Gerlene Burdock A. Alanda Amass, M.D.  Larina Earthly, M.D.  Coralyn Helling, MD

## 2010-12-29 NOTE — Assessment & Plan Note (Signed)
 HEALTHCARE                         ELECTROPHYSIOLOGY OFFICE NOTE   DEMARQUIS, OSLEY                      MRN:          [id number]  DATE:11/21/2007                            DOB:          12-25-1924    HISTORY OF PRESENT ILLNESS:  Mr. Soulier returns today for follow-up.  He  is a very pleasant elderly male with a history of intermittent complete  heart block with no ventricular response who underwent pacemaker  implantation several months ago.  He returns today for follow-up.  Of  note, post procedure the patient developed elevation of his ventricular  threshold and had a lead revision.  Today, he returns for evaluation.  He denies chest pain or shortness of breath.  He notes that in the  interim he has undergone skin excision for skin cancer on his back.   MEDICATIONS:  1. Vytorin 10/20.  2. Coumadin 5 mg daily.  3. Niacin 500 mg 2 tablets daily.  4. Lopressor 25 mg t.i.d.  5. Multivitamin.  6. Nexium 40 a day.   PHYSICAL EXAMINATION:  GENERAL:  He is a pleasant, well-appearing  elderly man in no distress.  VITAL SIGNS:  Blood pressure was 144/82, pulse 68 and regular,  respirations were 18.  Weight was 232 pounds.  NECK:  Revealed no jugular distention.  LUNGS:  Clear bilaterally to auscultation.  No wheezes, rales or rhonchi  are present.  CARDIOVASCULAR:  Regular rate and rhythm with normal S1-S2.  ABDOMEN:  Obese, nontender, nondistended.  EXTREMITIES:  Demonstrated no cyanosis, clubbing or edema.   Interrogation of his pacemaker demonstrates a [employer].  The P-  waves were 1.3.  There are no R waves secondary to pacemaker dependence.  The impedance was 650 ohms in the A, 560 in the V.  The threshold 0.8  and 0.4 in the atrium and 0.7 and 0.5 in the ventricle.  He was 7% A  paced and 100% V paced.  Today we turned his outputs down to 2 at 0.4 in  the atrium and 2.5 at 0.4 in the right ventricle.   IMPRESSION AND PLAN:  1.  Complete heart block.  2. Hypertension.  3. Status post pacemaker insertion.   DISCUSSION:  Overall Mr. Vantol is stable.  Will plan to see him back on  a p.r.n. basis.  I have encouraged him to follow back up with his  primary cardiologist, Dr. Alanda Amass, and will be available on an as  needed basis for additional follow-up.     Doylene Canning. Ladona Ridgel, MD  Electronically Signed    GWT/MedQ  DD: 11/21/2007  DT: 11/22/2007  Job #: [id number]   cc:   Gerlene Burdock A. Alanda Amass, M.D.

## 2011-01-01 NOTE — Op Note (Signed)
NAME:  Samuel Christensen, Samuel Christensen NO.:  [id number]   MEDICAL RECORD NO.:  [id number]          PATIENT TYPE:  OIB   LOCATION:  4732                         FACILITY:  [facility]   PHYSICIAN:  Richard A. Alanda Amass, M.D.DATE OF BIRTH:  May 28, 1925   DATE OF PROCEDURE:  07/02/2004  DATE OF DISCHARGE:                                 OPERATIVE REPORT   PERIPHERAL ANGIOGRAM AND INTERVENTION:   PROCEDURES:  1.  Retrograde abdominal aortic catheterization.  2.  Abdominal aortic CO2 angiography midstream PA projection.  3.  Selective left renal angiogram, CO2 angiography.  4.  Left renal stenosis transstenotic gradient measurement.  5.  Filter Wire EZ protection with left renal artery predilatation,      percutaneous transluminal angioplasty, and subsequent stenting with      Genesis 6 x 12 mm Cordis premounted lumen-expandable stent.   INDICATIONS:  High-grade left renal ostial stenosis with significant  systemic hypertension on multiple medications and chronic renal  insufficiency, for renal preservation with documented high-grade left ostial  renal artery stenosis.   OPERATING PHYSICIAN:  Richard A. Alanda Amass, M.D.   COMPLICATIONS:  None.   Heparin 3500 units, 5 mg valium p.o. premedication, 1% local Xylocaine,  Nubain 2 mg IV perioperative sedation.   BRIEF HISTORY:  Please refer to complete catheterization procedure of  June 26, 2004.   Essentially, Mr. Figueira is a 77 year old white married father of two with two  daughters and a grandchild.  He has exogenous obesity, bilateral total knee  replacement, sleep apnea, hypertriglyceridemia, hyperlipidemia, asthma, and  remote CABG by Dr. Andrey Campanile in 1994.  He has had multiple prior coronary  intervention and optional diagonal stent by Dr. Allyson Sabal July 2000 and August  2000.  LAD stenting and PTCA of the diagonal branch August 2000.  He  underwent graft intervention by Dr. Elsie Lincoln in August 2004 with triple ostial  and midbody  stenting, DES for high-grade disease of multiple sequential  graft to OM, OM, and distal OM.  He has a history of associated depression,  GERD, and upper GI disease.  He underwent diagnostic coronary angiography  and grafts on June 26, 2004, and has had patent stent to his LAD in the  native vessel and atretic LIMA which is old, patent sequential stents in the  sequential graft to the OM, OM-OM.  Patent sequential graft to the PDA and  the PLA.  He had 50-70% or less lesion which was unchanged in the proximal  third of the body of the graft to the single diagonal.  It was elected to  treat him medically.  At that time he had an EF of 50%, systemic  hypertension, and no significant right renal artery disease and high-grade  left renal artery stenosis of 90-95% with an 80 mmHg gradient.  His  creatinine was 1.7 to 1.9 post procedure in the past.  Post procedure it was  1.6 and 1.7 on the day of discharge.  He was readmitted for left renal  artery intervention and was to get Mucomyst and bicarb.  His creatinine,  however, over the ensuing period has  risen to 1.9-2.0.  It was felt best to  proceed with intervention for renal preservation and other indications  outlined above in this setting.  Full informed consent was obtained from the  patient and his wife to proceed.  We elected to do CO2 angiography and avoid  any dye administration if possible in this setting.   He was brought to the sixth floor PV lab in the postabsorptive state after 5  mg valium p.o.  The right groin was prepped and draped in the usual manner,  1% Xylocaine was used for local anesthesia, and the RCFA was entered in a  single anterior  puncture using a single 18 thin-walled needle, and a Wholey  wire was used to traverse his iliac system.  A 6 French sidearm sheath was  inserted and a 5 French pigtail catheter was placed upon the level of the  renal arteries, and CO2 angiography was done by hand injection through a   closed system set-up.  Arterial pressures were monitored throughout the  procedure and equaled 150-170 mmHg during the procedure.  Again, during the  procedure sublingual nitroglycerin was given and his pressure came down to  140.  He remained in sinus rhythm at a rate of approximately 60-65.   Abdominal angiogram demonstrated patent SMA and celiac axis with flow  visualized.  No significant right renal artery stenosis.  High-grade  daylight 95-99% left renal artery ostial stenosis.  The infrarenal  abdominal aorta had tortuosity with no significant disease, with mild  dilatation of the distal third above the iliac bifurcation.  The IMA  appeared intact.  The very proximal iliacs were intact.   The catheter was exchanged for a 6 French short JR4 guiding catheter.  Selective left renal injection was performed using CO2 angiography,  demonstrating position in the high-grade stenosis.  The lesion was crossed  with a 0.014 inch coronary Scimed filter wire EZ.  The wire was free and the  filter was deployed under fluoroscopic control in the mid-main left renal  artery.  The lesion was then predilated with a 3 mm x 2 cm Cordis Savvy  balloon at 8-40 across the ostium.  This was pulled back.  Unfortunately,  the filter wire was inadvertently pulled back by the technician during  exchange, so this had to be abandoned.  The lesion was then recrossed with a  0.014 inch stabilizer Cordis wire, which was free in the distal vessel and  passed without difficulty.  Using CO2 guidance with hand injections and DSA,  a Cordis 6 mm x 12 Genesis stent premounted on an Aviator balloon was  positioned across the ostium, extending approximately 2 mm into the aorta  and deployed at 10-40.  The balloon was pulled back across the ostium and  inflated at 9 atmospheres for 40 seconds.  The balloon was pulled back and final injections were performed along with transstenotic gradient.  The  transstenotic gradient at  the initial before starting the procedure with the  guiding catheter was 70-80 mmHg, similar to the prior diagnostic catheter  gradient.  At the end of the procedure there was no gradient across the  ostium, reduced to 0.  The stenosis was reduced from 95% to 0% with good  visualization on CO2 injection.  The area where the filter wire was present  showed no evidence of spasm or injury.  Dilatation system was removed,  sidearm sheath was flushed.  The patient was given sublingual nitroglycerin  with blood pressure  coming down to 130-140 mmHg and brought to the holding  area for ACT measurement and sheath removal.  He did receive 3500 units of  heparin after arterial access was obtained.   We plan to do follow-up renal duplex study post stenting with baseline  showing systolic velocity of 250 cm/sec. preoperatively on July 01, 2004, with normal right renal velocities, left kidney size of 9.7, right  kidney size of 12.8.   Final gradient was 0.  Final stenosis was 0%.   Will plan to discontinue his bicarb since he did not receive any dye during  the procedure, follow his renal function carefully, and follow up Dopplers.  Further follow-up of his coronary disease as outlined for moderate  noncritical disease in the graft to the diagonal as outlined above.   CATHETERIZATION DIAGNOSES:  1.  Renovascular hypertension.  2.  Chronic renal insufficiency.  3.  Systemic hypertension, drug-resistant.  4.  High-grade ostial left renal artery stenosis treated with predilatation,      percutaneous transluminal angioplasty, filter wire protection, and      balloon-expandable stenting, with CO2 angiography successful, July 02, 2004.  5.  Hyperlipidemia and hypertriglyceridemia.  6.  Exogenous obesity.  7.  History of depression and mild organic brain syndrome.  8.  Gastroesophageal reflux disease.  9.  Bilateral total knee replacement.  10. Coronary artery bypass graft x9, Dr.  Andrey Campanile, December 09, 1992, see details      above for extensive prior percutaneous coronary interventions.       RAW/MEDQ  D:  07/02/2004  T:  07/03/2004  Job:  [id number]   cc:   Sixth Floor CP Lab   Larina Earthly, M.D.  [address]  [place]  [place] [address]  Fax: [phone]   Petra Kuba, M.D.  [address], [address]  [place]  [place] [address]  Fax: [phone]   Antonietta Breach, M.D.   Doppler Lab, Dr. Kandis Cocking office

## 2011-01-01 NOTE — Op Note (Signed)
NAME:  Samuel Christensen, Samuel Christensen                         ACCOUNT NO.:  [id number]   MEDICAL RECORD NO.:  [id number]                   PATIENT TYPE:  AMB   LOCATION:  ENDO                                 FACILITY:  [facility]   PHYSICIAN:  Petra Kuba, M.D.                 DATE OF BIRTH:  1925/06/10   DATE OF PROCEDURE:  08/29/2003  DATE OF DISCHARGE:                                 OPERATIVE REPORT   PROCEDURE:  Colonoscopy with biopsy.   INDICATION:  Persistent abdominal pain, nondiagnostic work-up to date, due  for colonic screening, history of colon polyps, family history of colon  cancer.  Consent was signed prior to any premedications given after risks,  benefits, methods, options thoroughly discussed in the office with both the  patient and his wife on multiple occasions.   MEDICINES USED:  1. Demerol 30.  2. Versed 5.   DESCRIPTION OF PROCEDURE:  Rectal inspection was pertinent for external  hemorrhoids.  Digital exam was negative.  Video pediatric adjustable  colonoscope was inserted and despite a long, looping, tortuous colon, was  able to be advanced to the cecum by rolling him first on his back and then  on his right side with various abdominal pressures.  No obvious abnormality  was seen on insertion.  The cecum was identified by the appendiceal orifice  and the ileocecal valve.  The prep was fairly adequate, did require a  moderate amount of washing and suctioning.  He did have a long, looping  colon with some spasm and when we fell back around a loop where there was a  spastic wave, we did try to readvance to decrease the chance of missing  things.  But on slow withdrawal through the colon, other than the tiny  approximate hepatic flexure polyp which was cold biopsied x 2, no other  abnormalities were seen on slow withdrawal back to the rectum.  Anorectal  pull-through and retroflexion confirmed some small hemorrhoids.  Scope was  reinserted a short ways up the left side of the  colon; air was suctioned and  scope removed.  The patient tolerated the procedure well.  There was no  obvious immediate complication.   ENDOSCOPIC DIAGNOSES:  1. Internal-external hemorrhoids.  2. Hepatic flexure tiny polyp, cold biopsied.  3. Otherwise within normal limits to the cecum.   PLAN:  1. Continue work-up with an EGD.  2. Await pathology, but doubt repeat screening would be needed just for     screening based on other medical problems but happy to proceed in the     future if worrisome symptoms.  3. Please EGD for other work-up and plans.  Petra Kuba, M.D.    MEM/MEDQ  D:  08/29/2003  T:  08/29/2003  Job:  [id number]   cc:   Larina Earthly, M.D.  [address]  [place]  [place] [address]  Fax: [phone]

## 2011-01-01 NOTE — Discharge Summary (Signed)
NAME:  Samuel Christensen, Samuel Christensen NO.:  [id number]   MEDICAL RECORD NO.:  [id number]                   PATIENT TYPE:  INP   LOCATION:  2908                                 FACILITY:  [facility]   PHYSICIAN:  Nicki Guadalajara, M.D.                  DATE OF BIRTH:  1924/12/22   DATE OF ADMISSION:  03/31/2003  DATE OF DISCHARGE:  04/03/2003                                 DISCHARGE SUMMARY   DISCHARGE DIAGNOSIS:  1. Coronary artery disease status post intervention during this admission     with post-procedural subendocardial myocardial infarction.  2. Status post coronary artery bypass grafting remodeling.  3. Hypertension.  4. Dyslipidemia.  5. Gastroesophageal reflux disease.   This is a 77 year old Caucasian gentleman who was admitted to the hospital  by Dr. Tresa Endo on March 31, 2003, in the evening where he presented with  complaints of chest pain compatible with unstable angina.  Dr. Tresa Endo  admitted him and ruled out MI protocol and started him on IV nitroglycerin  and heparin.   The patient underwent a cardiac cath performed Dr. Elsie Lincoln on the next day,  April 01, 2003, and cath revealed a high-grade lesion in the saphenous vein  graft to the circumflex and 2 overlapped stents were inserted in the mean  and distal body of the graft and with good result and Angiomax therapy was  used during this procedure.   Next day after procedure, patient developed elevation of the cardiac  enzymes; pre-procedure all enzymes were negative and the first set after  procedure showed CK 198, CK-MB 19.2; second set - CK 265, CK-MB 29.1; third  set showed CK 277, CK-MB 21.5 and troponin was 2.30.  The next morning, the  patient remained stable with no anginal symptoms and no shortness of breath  or palpitations.   The next morning, Dr. Alanda Amass assessed the patient.  He looked over all  his laboratory data and found him stable for discharge home.  Patient  tolerated walking with  the cardiac rehab therapist with no problems and was  discharged home in stable in condition.   HOSPITAL LABORATORIES:  His lipid profile showed cholesterol 192,  triglycerides 861, low HDL 24, and LDL was not calculated secondary to  elevated triglycerides.   CBC showed white blood cell count 6.5, hemoglobin 13.3, hematocrit 37.3,  platelet count 160.  BMET showed sodium 135, potassium 4.2, chloride 101,  CO2 28, glucose 123, BUN 12, creatinine 1.   DISCHARGE MEDICATIONS:  1. Lipitor increased to 40 mg q.d.  2. Plavix 75 mg q.d.  3. Protonix 40 mg q.d.  4. Aspirin 81 mg q.d.  5. Metoprolol 100 mg b.i.d.  6. Atacand 16 mg q.d.  7. HCTZ 25 mg q.d.  8. Nitroglycerin 0.4 mg p.r.n.   ACTIVITY:  No driving, no lifting greater than 5 pounds, no strenuous  activities for 5 days.  DIET:  Low carbohydrates, low sweets, and low cholesterol diet.   WOUND CARE:  He is allowed to shower and instructed not to rub wound  puncture site - pat it dry - and report any problems with that to our  office, number was provided.   FOLLOW UP:  Appointment was scheduled with Dr. Alanda Amass on April 24, 2003, at 2:30 p.m.        Raymon Mutton, P.A.                    Nicki Guadalajara, M.D.    MK/MEDQ  D:  04/03/2003  T:  04/04/2003  Job:  [id number]   cc:   Gerlene Burdock A. Alanda Amass, M.D.  [phone] [address], [address]  [place]  [place] [address]  Fax: [phone]

## 2011-01-01 NOTE — Op Note (Signed)
[facility]  Patient:    Samuel Christensen, Samuel Christensen Visit Number: [id number] MRN: [id number]          Service Type: SUR Location: 4W [id number] 01 Attending Physician:  Skip Mayer Dictated by:   Georges Lynch Darrelyn Hillock, M.D. Proc. Date: 04/18/01 Admit Date:  04/18/2001                             Operative Report  PREOPERATIVE DIAGNOSIS: 1. Complete tear of the right rotator cuff tendon. 2. Severe impingement syndrome right shoulder.  POSTOPERATIVE DIAGNOSIS: 1. Complete tear of the right rotator cuff tendon. 2. Severe impingement syndrome right shoulder.  OPERATION PERFORMED: 1. Partial acromionectomy and acromioplasty, right shoulder. 2. Excision of subdeltoid bursa right shoulder. 3. Repair of a complete tear of the right rotator cuff tendon.  SURGEON:  Georges Lynch. Darrelyn Hillock, M.D.  ASSISTANT:  Della Goo, P.A.  ANESTHESIA:  General.  DESCRIPTION OF PROCEDURE:  Under general anesthesia, a routine orthopedic prep and drape of the right shoulder was carried out.  The patient was given one gram of IV Ancef.  At this time an incision was made over the anterior aspect of the right shoulder and bleeders were identified and cauterized. Self-retaining retractors were inserted.  I then detached the deltoid tendon by sharp dissection from the acromion and partially split the proximal portion of the deltoid muscle.  At this time I went down and identified the subdeltoid bursa and I excised the bursa.  It was noted that he had severe overgrowth of his acromion with severe impingement syndrome.  On abducting the shoulder, the acromion literally was imbedding into the rotator cuff and the rotator cuff was completely torn at this site.  I then introduced the Bennett retractor to protect the cuff.  I then did a partial acromionectomy with the oscillating saw and then an acromioplasty utilizing the bur.  Once this was carried out, I went down and excised the  subdeltoid bursa.  I thoroughly irrigated the shoulder out.  I then utilized the bur to bur down the lateral articular surface of the humerus and then created a nice bleeding bony bed.  At this time I then inserted three multitac sutures into the proximal humerus and sutured the tendon down in place in the usual fashion.   We had a good repair. I thoroughly irrigated out the area, reassessed deltoid tendon tendon and muscle in the usual fashion and then closed the remaining wound in the usual fashion.  The skin was closed with metal staples.  I injected about 12 cc 0.5% Marcaine with epinephrine into the shoulder and a sterile Neosporin dressing was applied.  The patient was then placed in a shoulder immobilizer. Dictated by:   Georges Lynch Darrelyn Hillock, M.D. Attending Physician:  Skip Mayer DD:  04/18/01 TD:  04/18/01 Job: [id number]

## 2011-01-01 NOTE — Discharge Summary (Signed)
NAME:  Samuel Christensen, Samuel Christensen NO.:  [id number]   MEDICAL RECORD NO.:  [id number]          PATIENT TYPE:  INP   LOCATION:  2031                         FACILITY:  [facility]   PHYSICIAN:  Abelino Derrick, P.A.   DATE OF BIRTH:  08-04-1925   DATE OF ADMISSION:  06/23/2004  DATE OF DISCHARGE:  06/27/2004                                 DISCHARGE SUMMARY   DISCHARGE DIAGNOSES:  1.  Unstable angina, catheterization this admission revealing patent grafts.      The plan is for medical therapy.  2.  Coronary artery bypass grafting in 1991.  3.  A total of 95% left renal artery stenosis at catheterization this      admission.  4.  Renal insufficiency with creatinine of 1.7 at discharge.  5.  Hyperlipidemia.  6.  Treated hypertension.  7.  Degenerative joint disease with bilateral total knee replacements.   HOSPITAL COURSE:  The patient is a 77 year old male followed by Dr.  Alanda Amass and Dr. Felipa Eth with a history of coronary artery disease.  He had  bypass surgery in April 1991.  He had a native LAD stent placed in the past  as well as SVG to OM stenting in August 2004.  He presented with unstable  angina.  He was admitted to the emergency room.  He was started on IV  heparin and nitroglycerin and set up for diagnostic catheterization.  Enzymes were negative.  Catheterization done by Dr. Alanda Amass on June 26, 2004, revealed a patent SVG to the PDA, patent SVG to the OM and distal  circumflex with patent stents.  A total of a 60% SVG to the diagonal and an  old atretic LIMA with a patent LAD stent site.  His ejection fraction was  50%.  There was a 95% left renal artery stenosis noted on catheterization.   PLAN:  Medical treatment of his coronary disease.  He will need outpatient  renal Dopplers with plan for left renal intervention.  He was given sodium  bicarbonate drip.  His creatinine on admission was 1.9.  At discharge, it  dropped to 1.5.  We feel it can be discharged late  on June 27, 2004.   DISCHARGE MEDICATIONS:  1.  Enteric-coated aspirin 81 mg a day.  2. Plavix 75 mg a day.  3. Toprol      XL 100 mg a day.  4. Lexapro 10 mg a day.  5. Vytorin 10/20 daily.  6.      Tricor 145 mg daily.  7. Protonix 40 mg a day.  8. Niacin 500 mg h.s.      9. Nitroglycerin sublingual p.r.n.   LABORATORY DATA:  EKG shows a sinus rhythm with a right bundle branch block  which is old.  Chest x-ray shows cardiomegaly, pulmonary vascular congestion  without significant edema.  He does have some pleural thickening in the  right hemithorax.  At discharge, his white count is 6.4, hemoglobin 13.6,  hematocrit 39.4, platelet count 190.  INR 1.0.  At discharge, his sodium is  137, potassium 4.0, BUN 19, creatinine 1.5,  LFTs are normal.  CK-MB and  troponins are negative x3.  Lipid profile shows cholesterol 149,  triglycerides 373, HDL 24, LDL 50, TSH 1.68.   DISPOSITION:  The patient is discharged in stable condition and will be  contacted by the office for a followup Doppler study and then follow up with  Dr. Alanda Amass to arrange for left renal artery intervention.       LKK/MEDQ  D:  08/11/2004  T:  08/11/2004  Job:  [id number]

## 2011-01-01 NOTE — Discharge Summary (Signed)
NAME:  AAKASH, HOLLOMON NO.:  [id number]   MEDICAL RECORD NO.:  [id number]          PATIENT TYPE:  INP   LOCATION:  2025                         FACILITY:  [facility]   PHYSICIAN:  Richard A. Alanda Amass, M.D.DATE OF BIRTH:  [birth year]   DATE OF ADMISSION:  02/10/2005  DATE OF DISCHARGE:  02/16/2005                                 DISCHARGE SUMMARY   ADMISSION DIAGNOSES:  1.  Pulmonary embolus with bilateral deep venous thrombosis.  2.  Recent Cypher stent for saphenous vein graft diagonal.  3.  Sleep apnea.  4.  Hypertension.  5.  Dyslipidemia.  6.  Peripheral vascular occlusive disease with left renal artery stenting.  7.  Chronic renal insufficiency.   DISCHARGE DIAGNOSES:  1.  Pulmonary embolus with bilateral deep venous thrombosis.  2.  Recent Cypher stent for saphenous vein graft diagonal.  3.  Sleep apnea.  4.  Hypertension.  5.  Dyslipidemia.  6.  Peripheral vascular occlusive disease with left renal artery stenting.  7.  Chronic renal insufficiency.   PROCEDURES:  Doppler study to the lower extremities, positive bilateral DVT.   BRIEF HISTORY:  The patient is a 77 year old white male who recently was  hospitalized and underwent cardiac catheterization. This showed saphenous  vein grafting RCA and patent native LAD stent.  LIMA to the LAD was atretic.  He had normal left ventricular function.  He did have a 70% and 80% stenosis  of the saphenous vein graft to the diagonal which was intervened and  underwent dilatation of the Cypher stent placement.  The patient continued  to have dyspnea on exertion. Subsequently underwent a VQ scan which shows  high probability.  He also has known elevated creatinine.  He was  subsequently seen by Dr. Sandrea Hughs and it was his opinion that the  patient should be admitted for heparin and Coumadin crossover.  We were  called and the patient was admitted to our service.   PAST MEDICAL HISTORY:  1.  Coronary artery  bypass grafting in 1991 with subsequent stenting to his      native LAD and most recently saphenous vein graft to diagonal, January 25, 2005.  2.  Left ventricular dysfunction, EF 45-50% with impaired left ventricular      relaxation.  3.  Morbid obesity.  4.  Sleep apnea on CPAP.  5.  Hypertension.  6.  Dyslipidemia for drug therapy.  7.  Peripheral vascular occlusive disease.  8.  Chronic renal insufficiency.   CURRENT MEDICATIONS:  1.  Plavix 75 mg  daily.  2.  Aspirin 81 mg daily.  3.  Multivitamin daily.  4.  Toprol-XL 100 mg daily.  5.  Lexapro 20 mg daily.  6.  Protonix 40 mg daily.  7.  Tricor 145 mg daily.  8.  Niacin 500 mg daily.  9.  Vytorin 10/20 daily.  10. Prilosec over-the-counter.  11. Albuterol inhaler p.r.n.   BRIEF HISTORY:  The patient is a 77 year old white male who has had bypass  x9 by Dr. Andrey Campanile in 1991.  At his last  catheterization he underwent PCI of  the stenting of his diagonal saphenous vein graft.  Since discharge he has  continued to be short of breath and dyspneic with exertion.  He was set up  for a VQ scan which was positive.  He then went to see Dr. Sandrea Hughs who  felt he had classic pulmonary embolus and recommended admission for heparin  and Coumadin crossover and further treatment as indicated.   HOSPITAL COURSE:  The patient was subsequently admitted at that time.  He  underwent Doppler studies which showed DVT in both lower extremities.  He  was placed on Refludan and started on Coumadin.  He has made quite a steady  progress and by February 16, 2005, it was Dr. Truett Perna opinion that he was  therapeutic and could be discharged home.   DISCHARGE MEDICATIONS:  He was subsequently discharged home on:  1.  Coumadin 5 mg half tablets on Monday, Wednesday and Friday and a whole      tablet on Tuesday, Thursday, Saturday, and Sunday.  2.  Continue his albuterol inhaler as before.  3.  Aspirin 81 mg daily.  4.  Vytorin 10/20 daily.  5.   Plavix 75 mg daily.  6.  Lexapro 20 mg daily.  7.  Tricor 145 mg daily.  8.  Toprol-XL 100 mg daily.  9.  Niacin 500 mg at night.  10. Prilosec daily as before.   FOLLOW UP:  He will return to see Dr. Alanda Amass in two to three weeks,  sooner if he has any problems.  Will have his pro-time checked next Friday  at the office.  We will monitor his pro-times in our office and keep him at  an INR of between 2 and 3.       WDJ/MEDQ  D:  02/16/2005  T:  02/16/2005  Job:  [id number]

## 2011-01-01 NOTE — Discharge Summary (Signed)
NAME:  Samuel Christensen, Samuel Christensen NO.:  [id number]   MEDICAL RECORD NO.:  [id number]          PATIENT TYPE:  OIB   LOCATION:  4732                         FACILITY:  [facility]   PHYSICIAN:  Richard A. Alanda Amass, M.D.DATE OF BIRTH:  1924/11/28   DATE OF ADMISSION:  07/02/2004  DATE OF DISCHARGE:  07/03/2004                                 DISCHARGE SUMMARY   ADMISSION DIAGNOSES:  1.  Admitted now for peripheral vascular angiogram secondary to peripheral      vascular disease seen at time of recent catheterization.  2.  Status post cardiac catheterization on June 26, 2004 by Dr. Pearletha Furl. Alanda Amass.  3.  History of past coronary artery disease.      1.  History of coronary artery bypass graft on December 12, 1989.      2.  Status post repeat catheterization, October 21, 2003.  4.  Hyperlipidemia.  5.  Hypertension.  6.  Gastroesophageal reflux disease.  7.  Depression.  8.  History of cholecystectomy.  9.  History of bilateral knee replacements.   DISCHARGE DIAGNOSES:  1.  Admitted now for peripheral vascular angiogram secondary to peripheral      vascular disease seen at time of recent catheterization.  2.  Status post cardiac catheterization on June 26, 2004 by Dr. Pearletha Furl. Alanda Amass.  3.  History of past coronary artery disease.      1.  History of coronary artery bypass graft on December 12, 1989.      2.  Status post repeat catheterization, October 21, 2003.  4.  Hyperlipidemia.  5.  Hypertension.  6.  Gastroesophageal reflux disease.  7.  Depression.  8.  History of cholecystectomy.  9.  History of bilateral knee replacements.  10. Status post renal angiogram by Dr. Alanda Amass, July 02, 2004.  No dye      was used; he used carbon dioxide.  He performed percutaneous      transluminal angioplasty/stent to a left renal artery and went from 90%      stenosis to 0% residual.  The patient tolerated the procedure well      without complications.   HISTORY  OF PRESENT ILLNESS:  Samuel Christensen is a 77 year old male who presents to  the outpatient area on July 02, 2004 with plans for PV angiogram for  renal angiogram today.  He was recently admitted for chest pain on June 26, 2004 and subsequently underwent catheterization by Dr. Alanda Amass.  This  revealed no significant change from his previous cath of October 21, 2003, in  regards to the coronary anatomy; however, he was found to have a 95% left  renal artery stenosis with an 80-mmHg gradient.  Therefore, at that time, he  had been discharged home with plans to return at this point for renal artery  intervention.  Risks and benefits of the procedure have been discussed with  him and he is agreeable to proceed.   HOSPITAL COURSE:  On July 02, 2004, Samuel Christensen underwent cardiac  catheterization by Dr. Gerlene Burdock  Alanda Amass.  Note that he used CO2 and no dye  was used.  He performed PTA and stent to the left renal artery.  It went  from greater than 90% stenosis to a 0% residual.  The gradient went from  greater than 80 mmHg of 0 mmHg.  The patient tolerated the procedure well  without complication.  See the dictated report for detail.   On July 03, 2004, Samuel Christensen is doing well without complaint.  Blood  pressure 122/69, heart rate 68.  Afebrile.  He has maintained sinus rhythm  on telemetry.  His post-procedure labs are stable; this includes a BUN of  32, creatinine 2.0.  At this point, he was seen and evaluated by Dr. Dani Gobble.  Plans are for the patient to have followup renal function test in  1 week, follow up his renal Doppler and follow up with Dr. Alanda Amass.  He  was seen and evaluated by Dr. Kem Boroughs, who deems him stable for  discharge home.   HOSPITAL CONSULTS:  None.   HOSPITAL PROCEDURES:  Renal angiogram on July 02, 2004 using carbon  dioxide by Dr. Susa Griffins.  He performed percutaneous transluminal  coronary angioplasty/stent to the left renal artery.   This went from greater  than 90% stenosis to 0% residual.  The patient tolerated the procedure well  without complications.   LABORATORIES:  On July 02, 2004, BMET showed a sodium of 130, potassium  4.3, BUN 35, creatinine 2.0.  On July 03, 2004, sodium 134, potassium  3.8, BUN 32, creatinine 2.0.  CBC shows white count 6.0, hemoglobin 13.0,  hematocrit 37.9, platelets 234,000.   RADIOLOGY:  Chest x-ray, June 24, 2004, showed:  #1 - Persistent pleural  thickening along the lateral aspect of the right hemithorax, #2 -  cardiomegaly with pulmonary vascular congestion, but without pulmonary  edema/segmental infiltrates.   ACCESSORY CLINICAL DATA:  EKG and telemetry have shown sinus rhythm, no  arrhythmias.   DISCHARGE MEDICATIONS:  1.  Aspirin 81 mg a day.  2.  Plavix 75 mg a day.  3.  Toprol-XL 100 mg a day.  4.  Lexapro 20 mg daily.  5.  Vytorin 10/20 daily.  6.  TriCor 145 mg daily.  7.  Protonix 40 mg daily.  8.  Niacin 500 mg twice a day.   ACTIVITY:  No strenuous activity, lifting greater than 5 pounds, driving for  3 days.   DIET:  Low-cholesterol diet.   WOUND CARE:  You may gently wash your groin site with warm water and soap.   Call [phone] if any bleeding or increased redness in your groin site.   SPECIAL DISCHARGE INSTRUCTIONS:  Have your blood drawn next week to check to  a BMET.   Note that a hemoglobin A1c was drawn the morning of discharge, but is  pending at the time of dictation.   This will be followed up as an outpatient.   FOLLOWUP:  You are scheduled for renal ultrasound, Thursday, August 06, 2004, 8 a.m.   The office is going to have to call the patient with an appointment to see  Dr. Alanda Amass, once they review his schedule further.       MBE/MEDQ  D:  07/03/2004  T:  07/04/2004  Job:  [id number]   cc:   Larina Earthly, M.D.  [address]  [place] [address]  Fax: [phone]

## 2011-01-01 NOTE — Discharge Summary (Signed)
NAME:  Samuel Christensen, Samuel Christensen                         ACCOUNT NO.:  [id number]   MEDICAL RECORD NO.:  [id number]                   PATIENT TYPE:  INP   LOCATION:  3734                                 FACILITY:  [facility]   PHYSICIAN:  Richard A. Alanda Amass, M.D.          DATE OF BIRTH:  Jan 10, 1925   DATE OF ADMISSION:  10/18/2003  DATE OF DISCHARGE:  10/23/2003                                 DISCHARGE SUMMARY   DISCHARGE DIAGNOSES:  1. Unstable angina pectoris, status post catheterization this admission,     etiology not determined.  2. Known atherosclerotic heart disease.  3. Probable gastroesophageal reflux disease, status post gastroenterology     consult this admission.  4. Recent weight loss, under investigation.  5. Depression.  6. Bilateral total knee replacement.  7. Status post coronary artery bypass graft in April 1994.  8. Hyperlipidemia.  9. Hypertension.   HOSPITAL COURSE:  This is a 77 year old, married father of two daughters and  grandfather with known history of coronary artery disease, status post  bypass in April 1994, and subsequent percutaneous coronary interventions in  July 2000.  He had an optional diagonal stenting by Dr. Allyson Sabal.  In August  2000, he had native LAD proximal stenting and diagonal angioplasty.  In  August 2004, Dr. Elsie Lincoln used Cypher stent for SVG to several marginal  branches.   The patient presented to the emergency room at [facility] with the  history of 20-30 pounds of weight loss with chest pain and was referred to  [facility] for further evaluation of chest pain.  Procedure  performed was a catheterization was performed by Dr. Alanda Amass on October 21, 2003.  It showed well-preserved left ventricular function, patent stent in  his proximal mid saphenous vein graft to multiple marginal branches and  distal circumflex, patent graft to right coronary artery and to diagonal  with 50-60% narrowing and widely patent stent in the left  anterior  descending.  At this point, no other procedure was performed and the  recommendations were to continue with medical therapy.   The etiology of weight loss was not clear and on the catheterization, it was  noted the patient had widely opened celiac and mesentery arteries.  On October 18, 2003, he was seen by Dr. Luther Parody for gastroenterology consult and  recommendations were given for Protonix.  For any current GI complaints,  etiology was not found.  The patient had upper and lower endoscopy six weeks  prior to admission to the hospital and those tests were unrevealing.  Reportedly, a CT scan was unrevealing.  He recommends to proceed with  psychiatric evaluation for further antidepressant therapy.   Psychiatric consult was on October 22, 2003.  Dr. Jeanie Sewer saw the patient and  he recommended adding Remeron to the patient's Lexapro that was started  already by Dr. Alanda Amass prior to psychiatric consult.  On October 23, 2003,  the patient was  assessed by Dr. Allyson Sabal and deemed to be stable for discharge  home.   LABORATORY DATA AND X-RAY FINDINGS:  White blood cell count 8.7, hemoglobin  16.1, hematocrit 47.3, platelets 224.  Sodium 134, potassium 4.2, chloride  97, CO2 30, glucose 112, BUN 16, creatinine 1.2, calcium 9.9.  CEA 1.3 with  PSA 0.41.  Erythrocyte sedimentation rate 7.  Vitamin B12 443 which was  normal.  TSH 1.300.  Lipid profile with total cholesterol 104, triglycerides  264, HDL 25, LDL 26.  Cardiac enzymes were negative during this admission.  Lipase was 40, amylase 51.  Hepatic function was within normal range.  Magnesium 2.0.  Fecal occult blood negative.   CT of the abdomen on March 5, showed surgically absent gallbladder,  otherwise unremarkable.  CT of the pelvis did not reveal any abnormalities.  The same day, chest CT showed basilar segmental atelectasis which was  negative for focal infiltrates, but there was a fullness of the main  pulmonary trunk raising  suspicion for pulmonary artery hypertension.   DISCHARGE MEDICATIONS:  1. Lipitor 20 mg q.d.  2. Maxzide 37/25 mg q.d.  3. Protoxin 40 mg q.d.  4. Aspirin 81 mg q.d.  5. Plavix 75 mg q.d.  6. Toprol XL 100 mg q.d.  7. Niacin 500 mg q.d.  8. Remeron 15 mg q.d., 1/2 pill at night.  9. Lexapro 10 mg q.d.   ACTIVITY:  No driving, no heavy lifting, no strenuous activity x3 days post  catheterization.   DIET:  Low fat, low cholesterol diet.   SPECIAL INSTRUCTIONS:  The patient was allowed to shower and instructed to  stop Effexor for now.   FOLLOW UP:  He will be seen in our office by Darcella Gasman. Annie Paras, N.P., on November 05, 2003, at 1:45 p.m.  Dr. Felipa Eth will see the patient next week.  Phone  number is given for the patient to call and scheduled appointment.      Raymon Mutton, P.A.                    Richard A. Alanda Amass, M.D.    MK/MEDQ  D:  10/23/2003  T:  10/24/2003  Job:  [id number]   cc:   Larina Earthly, M.D.  [address]  [place]  [place] [address]  Fax: [phone]   [facility]

## 2011-01-01 NOTE — Cardiovascular Report (Signed)
NAME:  WILMAR, PRABHAKAR NO.:  [id number]   MEDICAL RECORD NO.:  [id number]          PATIENT TYPE:  OIB   LOCATION:  6527                         FACILITY:  [facility]   PHYSICIAN:  Richard A. Alanda Amass, M.D.DATE OF BIRTH:  [birth year]   DATE OF PROCEDURE:  01/25/2005  DATE OF DISCHARGE:                              CARDIAC CATHETERIZATION   PROCEDURE:  Retrograde central aortic catheterization, selective coronary  angiography via Judkins technique, saphenous vein graft angiography, LV  angiogram RAO and LAO projection, selective renal angiography hand  injection, weight adjusted heparin, Plavix 150 milligrams additional,  Aggrastat bolus plus infusion, PTCA and stent distal third SVG to diagonal,  PTCA and stenting proximal third SVG to diagonal.   BRIEF HISTORY:  Please refer to H&P. Please refer to complete  catheterization report of June 26, 2004 and PV angiogram report of  July 02, 2004.   Mr. Eichel is a 77 year old white married father of 6 with 10 grandchildren  and 3 great-grandchildren who lives with his wife who is retired from the  [employer]. He is a nonsmoker, has sleep apnea, exogenous obesity,  renovascular hypertension, clinical depression, BPH, and extensive coronary  disease.   He has had remote CABG x9 by Dr. Delsa Grana. Wilson in 1991. He has prior LAD  stent with atretic LIMA in the year 2000. He had complex triple tandem  stents by Dr. Madaline Savage to the SVG to two marginals and distal  circumflex on April 01, 2003 with DES stent and no restenosis at last  study. His last catheterization was June 26, 2004 where he had 60-70%  narrowing of the proximal third of the SVG to the diagonal and 40-50%  narrowing of the distal third of this graft. He had a patent LAD stent,  patent triple tandem stents of the proximal and mid SVG to the OM/OM/distal  OM and distal circumflex and patent sequential SVG to the PDA PLA at that  time.  He had high grade 95% left renal artery stenosis with an 80 mm  gradient. He subsequently underwent left renal artery PTA and stenting with  filter wire protection and with CO2 and gadolinium on August 01, 2004  where the creatinine was 2.0 and this was done with a 6 x 12 Genesis Aviator  Genesis stent done on an Aviator balloon with excellent angiographic result  uncomplicated.   The patient subsequently developed symptoms of fluid overload, heart  failure, shortness of breath, and recurrent angina. It was difficult to tell  clinically the extent based on his clinical history. It was to tell whether  his dyspnea on exertion was multifactorial or related to ischemia. Because  of renal insufficiency, there was concern about recatheterization but the  family and the patient agreed to this and pushed for recatheterization since  he was severely limited. The patient was hydrated. He also has a history of  past bilateral total knee replacement and hyperlipidemia. He is on aspirin  and Plavix. The patient was hydrated preoperatively, started on Mucomyst and  given bicarbonate drip per pharmacy protocol. Visipaque dye was used  throughout the procedure.   DESCRIPTION OF PROCEDURE:  He was brought to the second floor CP lab in a  post absorptive state. Right groin was prepped, draped in the usual manner.  1% Xylocaine was used for local anesthesia. He was given 2 milligrams of  Nubain IV for sedation. The CFRA was entered with single anterior puncture  using 18 thin-wall needle and a 6-French short Daig sidearm sheath was  inserted without difficulty. Selective coronary angiography of the left  coronary was done with a 6-French 4 cm taper preformed coronary catheter and  right coronary catheter was used for saphenous vein graft angiography. LIMA  was not done and native right coronary was injected which was occluded in  the past to conserve dye. We did do LV angiogram at 25 cc 14 cc per  second  RAO and 20 cc 12 cc per second LAO projection through a pigtail 6-French  catheter. Pullback pressure CA showed no gradient across the aortic valve.  Selective left renal and right renal angiography was done with limited hand  injection of dye with the right coronary catheter which demonstrated less  than 30%. Right renal artery narrowing with good residual lumen. Less than  30% left renal artery in-stent narrowing with good flow and lumen within the  previously placed left renal stent.   The patient tolerated the diagnostic procedure well.   ANGIOGRAPHIC DATA:  The main left coronary was normal.   The left anterior descending artery had 30% narrowing within the previously  placed LAD stent from 2000 (no restenosis). The LAD coursed to the apex of  the heart where it bifurcated. There were diffuse irregularity with no high-  grade stenosis. There was a diagonal branch proximal third of the LAD that  had a 90% stenosis arising just from within the previously stented area and  this was unchanged with good flow to a thin diagonal. Remainder of the LAD  had no significant stenosis and the septal perforator was intact.   The circumflex was totally occluded after a small marginal branch and atrial  branch. This is old.   Previous angiography showed 100% RCA occlusion.   Saphenous vein graft to the two marginal branches of the right coronary and  distal PDA PLA was patent. There was 70% narrowing of the PLA and the distal  portion of the graft into the PLA. This did involve the bend in the PLA  branch. There was good flow and it was smooth. The anastomosis to the PDA  was entered with no significant stenosis.   The saphenous vein graft to two marginals showed one marginal occlusion  which was old, second marginal was small and patent. The anastomosis to the  small distal marginal branch and the distal circumflex was widely patent. The proximal and ostial stent had 40-50% eccentric  narrowing with good  residual lumen and flow. The tandem mid SVG stents had no significant  stenosis (less than 10%). The remainder of the graft had diffuse disease but  no high-grade stenosis and was unchanged from November 2005.   Saphenous vein graft to the diagonal had 70-80% stenosis in the proximal  third and 80% stenosis in the distal third with decreased dye density. There  was diffuse disease at this point. The diagonal was of moderate size single  vessel that bifurcated.   Previous LIMA injection was atretic and this was not repeated.   There was no gradient across the aortic valve on catheter pullback and LV  pressure was 140/0; LVEDP 20 mmHg.   It was elected to proceed with culprit lesion PCI in the setting of the SVG  to diagonal.   The patient was given Aggrastat bolus plus effusion. He was given 150  milligrams extra Plavix. The SVG to diagonal was catheterized with a 6-  French left coronary bypass guide Cordis. The lesion was crossed with a  0.014 inches Asahi soft wire. Despite the fact that it was difficult to  engage the ostium of the graft, we were able to manipulate the balloons and  stents across this. The distal graft lesion was crossed with a 3.0 Voyager  guided balloon. This was dilated 6-28 and 7-26 and guide wire was brought up  to the graft ostia with the balloon inflated. The balloon was then exchanged  for a 3.0 x 13 Cypher stent and this was positioned across the distal  stenosis in the graft and dilated at 14 and deployed at 14/32. The balloon  was then pulled back. The proximal third of the stenosis of the graft was  directly stented with a 3.0/12 Taxus stent deployed at 18-47 post dilated at  20-33. The balloon was pulled back and final injection showed good  angiographic result. There was 30-40% smooth narrowing beyond the proximal  third of the graft. The proximal lesion was reduced from 75% to 0% with no  dissection.   The distal third of the  graft was reduced from 80% hazy to 0%. There  remained TIMI III flow with no dissection and no reflow phenomenon.   The patient has had successful culprit lesion DES stenting of the proximal  and distal thirds of the SVG to the diagonal.   He does have residual disease of the native PLA and the distal portion of  the sequential limb to the PLA. This is smooth and approximately 70%  narrowed and would technically represent a difficult barrier to stent. Not  knowing whether this was of any significance clinically and it was felt best  not to pursue this at this time. The patient's renal function will have to  be monitored closely postoperatively in view of his chronic renal  insufficiency.   He has a patent stent to the left renal artery from July 02, 2004  intervention.   Clinical follow-up will be necessary to determine if he has improvement in  his dyspnea on exertion and chest pain. We will continue ancillary medical therapy for his comorbid problems.   CATHETERIZATION DIAGNOSES:  1.  Long history coronary artery disease with multivessel coronary artery      bypass graft x9 December 09, 1992 by Dr. Andrey Campanile.  2.  Multiple interventions subsequently a nondrug-eluting stent left      anterior descending artery stent 2000 patent on this study.  3.  Triple tandem proximal and midsaphenous vein graft to sequential to the      circumflex artery August 2004 patent on this study drug-eluting stents.  4.  Patent sequential saphenous vein graft to posterior descending artery      and posterolateral artery and to right ventricular marginal branches      with 70% posterolateral artery stenosis as outlined above.  5.  Culprit lesion with progression of disease saphenous vein graft proximal      and distal to diagonal, treated successfully with drug-eluting stenting      as outlined above.  6.  A hypokinesis midanterolateral wall ejection fraction approximately 55%      post premature  ventricular contractures with angiographic left      ventricular hypertrophy no significant mitral regurgitation.  7.  Exogenous obesity.  8.  Depression.  9.  Sleep apnea.  10. Dyspnea on exertion possible chronic obstructive pulmonary disease      versus ischemia related.  11. Renal insufficiency, chronic status post left renal artery percutaneous      transluminal angioplasty and stent July 02, 2004 widely patent on      this study.  12. Hyperlipidemia.  13. Gastroesophageal reflux disease.  14. Bilateral total knee replacement.       RAW/MEDQ  D:  01/25/2005  T:  01/25/2005  Job:  [id number]   cc:   Patient's chart   CP Lab   Antonietta Breach, M.D.   Larina Earthly, M.D.  [address]  [place]  [place] [address]  Fax: [phone]

## 2011-01-01 NOTE — Discharge Summary (Signed)
NAME:  Samuel Christensen, Samuel Christensen NO.:  [id number]   MEDICAL RECORD NO.:  [id number]          PATIENT TYPE:  OIB   LOCATION:  6527                         FACILITY:  [facility]   PHYSICIAN:  Richard A. Alanda Amass, M.D.DATE OF BIRTH:  1925/04/12   DATE OF ADMISSION:  01/25/2005  DATE OF DISCHARGE:  01/26/2005                                 DISCHARGE SUMMARY   DISCHARGE DIAGNOSES:  1.  Dyspnea on exertion, questionable anginal equivalent.  SVG to diagonal      cipher stenting this admission.  2.  Coronary artery disease, coronary artery bypass grafting in 1991.  3.  Normal LV function.  4.  Peripheral vascular disease with prior renal artery stenting, patent at      catherization this admission.  5.  Renal insufficiency, creatinine 1.6 at discharge.  6.  Sleep apnea, the patient is on CPAP.  7.  Treated hypertension.  8.  Dyslipidemia, on four-drug therapy.  9.  Depression, on Lexapro.   HOSPITAL COURSE:  The patient is a 77 year old male followed by Dr.  Alanda Amass who had bypass surgery x9 by Dr. Andrey Campanile in 1991.  His last  catherization was November 2005.  He has normal LV function.  The old LIMA  was atretic but the LAD showed no restenosis from prior stent placement.  He  has been having increasing shortness of breath.  It was thought at one point  that he had heart failure and he was on put on Zaroxolyn and Lasix and his  creatinine went up to 2.2.  This was stopped and his creatinine has come  down to 1.4.  He is admitted now for a diagnostic catherization.  This was  done 01/25/05.  This revealed a patent SVG to the RCA, patent native LAD  stent, patent SVG to the circumflex, and a 70-80% stenosis in the SVG to the  diagonal.  The SVG to the diagonal site was stented with a cipher stent with  good results.  The patient was placed on Aggrastat.  He did receive Mucomyst  and sodium bicarbonate preoperative.  Postoperatively he is stable.  We feel  he can be discharged January 26, 2005. His creatinine is 1.6.  Dr. Alanda Amass  suggests we be careful about over diuresing him and has stopped his Lasix at  discharge.   DISCHARGE MEDICATIONS:  1.  Coated aspirin once a day.  2.  Plavix 75 mg a day.  3.  Toprol XL 100 mg a day.  4.  Lexapro 20 mg a day.  5.  Tricor 145 mg a day.  6.  Niacin 500 mg h.s.  7.  Vytorin 10/20 daily.  8.  Prilosec 20 mg daily.   DISCHARGE LABORATORY DATA:  White count 6.8, hemoglobin 12.8, hematocrit  37.6, platelets 213.  Sodium 136, potassium 3.3, BUN 23, creatinine 1.6, his  troponin was 0.1, CK is negative.  Chest x-ray on January 25, 2005 shows no  acute changes.  INR 1.0.   DISPOSITION:  The patient is discharged in stable condition and will  followup in the office in a couple of  weeks.       LKK/MEDQ  D:  01/26/2005  T:  01/26/2005  Job:  [id number]

## 2011-01-01 NOTE — Op Note (Signed)
NAME:  Samuel Christensen, Samuel Christensen                         ACCOUNT NO.:  [id number]   MEDICAL RECORD NO.:  [id number]                   PATIENT TYPE:  AMB   LOCATION:  ENDO                                 FACILITY:  [facility]   PHYSICIAN:  Petra Kuba, M.D.                 DATE OF BIRTH:  03-25-1925   DATE OF PROCEDURE:  08/29/2003  DATE OF DISCHARGE:                                 OPERATIVE REPORT   PREOPERATIVE DIAGNOSIS:  Esophagogastroduodenoscopy.   INDICATIONS:  Abdominal pain, nondiagnostic colonoscopy.  Consent was signed  after risks, benefits, methods, and options thoroughly discussed prior to  any premedications given and in the office on multiple occasions with both  the patient and his wife.   Additional medicines for this procedure:  Demerol 10 mg, Versed 2 mg, since  it followed the colonoscopy.   DESCRIPTION OF PROCEDURE:  The video endoscope was inserted by direct  vision.  Esophagus was normal.  The scope was advanced  into the stomach and  advanced through a normal antrum and normal pylorus to a normal duodenal  bulb and around the C-loop to a normal second portion of the duodenum.  The  scope was withdrawn back to the bulb and a good look there ruled out ulcers  in that location.  The stomach was evaluated on retroflex and straight  visualization with a good look at the lesser and greater curve, angularis,  fundus, and cardia, and other than some mild gastritis throughout no  abnormalities were seen.  After complete stomach evaluation, air was  suctioned, the scope was slowly withdrawn.  He did have a tiny hiatal  hernia.  The esophagus was normal.  The scope was removed.  The patient  tolerated the procedure well.  There was no obvious immediate complication.   ENDOSCOPIC DIAGNOSES:  1. Tiny hiatal hernia.  2. Minimal gastritis.  3. Otherwise normal EGD.   PLAN:  Call p.r.n., otherwise follow up in two months.  Consider a small  bowel follow-through to complete his GI  workup, and will go ahead and make  sure he is on pump inhibitors, and an over-the-counter Prilosec would be  fine.                                               Petra Kuba, M.D.    MEM/MEDQ  D:  08/29/2003  T:  08/29/2003  Job:  [id number]   cc:   Larina Earthly, M.D.  [address]  [place]  [place] [address]  Fax: [phone]

## 2011-01-01 NOTE — Cardiovascular Report (Signed)
NAME:  Samuel Christensen, Samuel Christensen                         ACCOUNT NO.:  [id number]   MEDICAL RECORD NO.:  [id number]                   PATIENT TYPE:  INP   LOCATION:  2908                                 FACILITY:  [facility]   PHYSICIAN:  Madaline Savage, M.D.             DATE OF BIRTH:  [birth year]   DATE OF PROCEDURE:  04/01/2003  DATE OF DISCHARGE:                              CARDIAC CATHETERIZATION   PROCEDURES PERFORMED:  1. Selective coronary angiography of the native coronary circulation.  2. Retrograde left heart catheterization.  3. Left ventricular angiography.  4. Selective visualization of the left internal mammary artery graft.  5. Selective visualization of multiple saphenous vein grafts.   ENTRY SITE:  Right femoral.   DYE USED:  Omnipaque.   COMPLICATIONS:  None.   MEDICATIONS GIVEN:  Angiomax intravenously given during the procedure  producing an ACT of 304 seconds, fentanyl 25 mg given twice for back pain  and sedation with excellent results.   RESULTS:  PRESSURES:  The left ventricular pressure was 150/15 with an end-  diastolic pressure of 24. Central aortic pressure was 150/65, mean of 100.  No aortic valve gradient by pullback technique.   ANGIOGRAPHIC RESULTS:  1. The patient had what appeared to be two radioopaque stents in his     proximal and mid LAD which later proved to be angiographically patent.     There were multiple clips and surgical sutures angiographically.  No     valvular calcification or pericardial calcifications were seen.  2. The left main coronary artery appeared to be slightly tapered proximally     and was a medium sized vessel with no lesions.  3. The circumflex was 100% occluded in its proximal portion just after the     origin of a tiny obtuse marginal branch that may well be an occluded     branch.  4. The LAD itself contained lumpy bumpy irregularities proximal, mid, and     distal.  The vessel does fill all the way to near the apex.   There is one     diagonal branch which is completely occluded.  5. The right coronary artery is 100% occluded at the ostium.  6. A saphenous vein graft to right coronary artery with multiple touchdown     sites to PDA and PLA, among others, appears widely patent throughout with     good runoff distally.  7. A vein graft to the obtuse marginal branch, intermediate ramus branch,     and circumflex contains a radioopaque stent proximally and in the ostium     of the vessel.  There is some luminal irregularity just after this stent     in the vein graft which is no more than 20% eccentrically narrowed.     There is TIMI 3 flow throughout the entire vessel.  8. Just after the first anastomosis to an obtuse marginal branch there  is a     90% eccentric stenosis that is hazy.  At the origin of the second     touchdown site which is a second obtuse marginal branch there appears to     be a 75% longer concentric stenosis 75%.  The distal circumflex is     patent, though diseased and runoff is fairly good.  9. The saphenous vein graft to a small diagonal branch contains luminal     irregularities throughout and areas of 30-40% narrowing proximally and     mid, but no significant obstructive lesions.  The distal runoff into the     small diagonal branch appears good.  10.      The left internal mammary artery is atretic and is ineffective at     producing any collateral flow to the LAD.  11.      The left ventricle shows good contractility with no wall motion     abnormalities and I estimate ejection fraction at 55%.   The percutaneous intervention performed on this patient was performed with a  7-French catheter based system with a 7-French sheath.  The guide catheter  was a hockey stick 7-French left guide catheter with side holes.  The  guidewire during the case was a filter wire and there were no pre dilatation  balloons used.  The filter wire was placed with fairly much difficulty in  the  distal portion of the vein graft with the wire extending well into the  distal circumflex obtuse marginal branch which was the distal most  anastomotic site.  I confirmed in two views that the filter wire was making  good contact with the vessel wall.   I deployed the first Cypher stent which was 3.5 x 18 mm centrally placed  over the stenotic 75% stenosis and deployed it to 14 atmospheres twice and  later post dilated.  See below.   The second overlapping Cypher stent was likewise directly deployed without  any pre dilatation with a balloon catheter and was positioned just to be  overlapped with the first stent.  This was a 3.5 x 23 mm stent likewise  deployed to 14 atmospheres on two occasions.   I then post dilated both stents and their overlap site with a Quantum  Maverick balloon 3.75 x 20 and after confirming and maintaining good  position of the distal basket of the filter wire I took post balloon  dilatation pictures of the vessel and showed that there was a uniformly  smooth edge to the entire vessel including the stented portions and there  was preservation of TIMI 3 distal flow.  I viewed the vessel in two views.   The patient had no hemodynamic rhythm or chest pain complications during  procedure and left the catheterization laboratory with stable vital signs  having tolerated procedure well.   FINAL DIAGNOSES:  1. Multivessel native coronary artery disease.     a. 100% right coronary artery occlusion at the ostium.     b. Proximal 100% circumflex occlusion.  2. Left anterior descending disease proximal and mid status post stenting     with widely patent stents in the left anterior descending mid and     proximal.  3. Patent saphenous vein graft to right coronary artery and multiple     branches.  4. Patent vein graft to diagonal branch with good distal runoff.  5. Patent, but aggressively diseased circumflex saphenous vein graft with    75% stenosis distally  and 90%  stenosis mid.  6.     Successful filter wire guided two stent deployment into mid and distal     circumflex with reduction of 75 and 90% lesions to 0% residual with     preservation of TIMI 3 class flow.  7. Good left ventricular function.  Ejection fraction 55%.                                               Madaline Savage, M.D.    WHG/MEDQ  D:  04/01/2003  T:  04/02/2003  Job:  [id number]   cc:   Gerlene Burdock A. Alanda Amass, M.D.  [phone], [address]  [place]  [place] [address]  Fax: [phone]   Cath Lab

## 2011-01-01 NOTE — Consult Note (Signed)
NAME:  Samuel Christensen, Samuel Christensen                         ACCOUNT NO.:  [id number]   MEDICAL RECORD NO.:  [id number]                   PATIENT TYPE:  INP   LOCATION:  3735                                 FACILITY:  [facility]   PHYSICIAN:  Althea Grimmer. Luther Parody, M.D.            DATE OF BIRTH:  07-18-1925   DATE OF CONSULTATION:  10/18/2003  DATE OF DISCHARGE:                                   CONSULTATION   BRIEF HISTORY:  Mr. Frye is a 77 year old male, whom I am asked to see by  Dr. Elsie Lincoln for vague abdominal discomfort and weight loss.  He has been  followed by my partner, Dr. Ewing Schlein, in the past several years.  He has a  history of colonic adenomatous polyps.  However, related to similar  complaints, he underwent upper and lower endoscopy on August 29, 2003.  Upper endoscopy revealed a minimal hiatal hernia and minimal gastritis.  Colonoscopy only reveals a diminutive ascending colon adenoma, which was  removed.  There were no other significant findings.   The patient is admitted for chest discomfort with clamminess and some  persisting epigastric aching.  He apparently recently had a syncopal  episode, after taking his antidepressant medication incorrectly.  At the  current time in the emergency room he is complaining of a back ache, but he  states his stomach does not hurt him at the present.  He denies dysphagia,  nausea, vomiting, melena or hematochezia.  He says he just has no appetite  and has lost around 35 pounds in the past four months.   In 2003 he had a CT scan of his abdomen performed in December, which was  negative.  Reportedly, he recently had a CT scan performed at [facility] -- which was also negative; although I do not have this report  available to me at the present, but will try and track it down.   PAST MEDICAL HISTORY:  1. Coronary artery disease.  He is status post CABG in 1991, with stent     placement in August 2004.  2. Hypertension.  3. Hyperlipidemia.  4.  Depression.  5. History of nephrolithiasis.  6. Reflux disease.   PAST SURGICAL HISTORY:  1. Status post bilateral total knee replacements.  2. Cholecystectomy.   CURRENT MEDICATIONS:  1. Lipitor 20 mg q.d.  2. Effexor 37.5 mg q.d.  3. Hydrochlorothiazide 25 mg q.d.  4. Triamterene 37.5 mg q.d.  5. Protonix 40 mg q.d.  6. Aspirin 81 mg q.d.  7. Plavix 75 mg q.d.  8. Metoprolol XL 100 mg q.d.  9. Darvocet N 100 p.r.n.  10.      Ativan 0.5 mg q.i.d.   ALLERGIES:  NONSTEROIDALS (have caused stomach upset).   FAMILY HISTORY:  He does have a sister who had colon cancer.   SOCIAL HISTORY:  He is married 57 years.  Does not smoke nor drink.  Retired  from  [employer].   REVIEW OF SYSTEMS:  GENERAL:  A 35-pound weight loss.  ENDOCRINE:  Reported  metabolic syndrome, though I do not believe he is on any hypoglycemic agents  at the present.  SKIN:  No rash or pruritus.  HEENT:  Eyes -- no icterus or  change in vision.  Ears, nose, throat -- No aphthous ulcer or chronic sore  throat.  RESPIRATORY:  Mild exertional dyspnea.  CARDIAC:  As above.  GI:  As above.  GENITOURINARY:  History of nephrolithiasis, but no current  dysuria or hematuria.  MENTAL STATUS:  Depression.   PHYSICAL EXAMINATION:  GENERAL:  He is an elderly, depressed-appearing male  in no acute distress.  VITAL SIGNS:  Afebrile, blood pressure 112/89, pulse 70 and regular.  SKIN:  Normal.  HEENT:  Eyes anicteric.  Oropharynx unremarkable.  NECK:  Supple without thyromegaly.  There is no cervical or inguinal  adenopathy.  CHEST:  Chest sounds are clear.  HEART:  Heart sounds regular rate and rhythm.  ABDOMEN:  Soft with active bowel sounds.  I do not appreciate any masses,  tenderness or organomegaly.  There is no rebound or hernia.  The abdomen is  obese.  RECTAL:  Not repeated, but is reportedly normal and guaiac negative.  EXTREMITIES:  Venous stasis dermatitis.   LABORATORY DATA:  Hemoglobin 14.8, white blood  count 6.4, platelet count  189.  Prothrombin Time 13.4, with an INR of 1.0 .  Occult blood testing  negative.  Cardiac enzymes negative x1.  Electrolytes are normal.  BUN 16,  creatinine 1.1.  Liver function tests normal.  Amylase and lipase normal.   IMPRESSION:  A 77 year old depressed male with atherosclerotic coronary  artery disease, and chronic GI complaints for which no etiology has been  found.  I do not believe at this point that repeating upper and lower  endoscopy are in order, since he had these tests approximately six weeks  ago.  These tests were unrevealing.  In addition, reportedly a CT scan was  unrevealing, but I do not have this official report available to me at the  present.   I suspect his symptoms are functional and related to depression.  Other  possibilities could include pancreatic pathology such as a neoplasm or  mesenteric ischemia.   RECOMMENDATIONS:  1. Continue Protonix.  2. Locate recent CT scan or repeat.  3. Consider mesenteric MRI angiogram.  4. Check TSH.  5. Consider psychiatric evaluation for further antidepressant therapy.                                               Althea Grimmer. Luther Parody, M.D.    PJS/MEDQ  D:  10/18/2003  T:  10/19/2003  Job:  [id number]

## 2011-01-01 NOTE — Cardiovascular Report (Signed)
NAME:  Samuel Christensen, Samuel Christensen                         ACCOUNT NO.:  [id number]   MEDICAL RECORD NO.:  [id number]                   PATIENT TYPE:  INP   LOCATION:  3734                                 FACILITY:  [facility]   PHYSICIAN:  Richard A. Alanda Amass, M.D.          DATE OF BIRTH:  [birth year]   DATE OF PROCEDURE:  10/21/2003  DATE OF DISCHARGE:                              CARDIAC CATHETERIZATION   PROCEDURES PERFORMED:  1. Retrograde central aortic catheterization.  2. Selective coronary angiography by Judkins technique.  3. Left ventricular angiogram, right anterior oblique and left anterior     oblique projections.  4. Subselective left internal mammary artery and left subclavian injection.  5. Abdominal angiogram, posterior anterior and lateral projections.  6. Saphenous vein graft angiography.   CARDIOLOGIST:  Richard A. Alanda Amass, M.D.   BRIEF HISTORY:  Mr. Guertin is a 77 year old married father of two daughters  and grandfather with mesomorphic habitus, short stature, obesity, sleep  apnea, and nonsmoker, but history of COPD and asthma.  Remote multivessel  CABG by Dr. Particia Lather, December 09, 1992.  Subsequent percutaneous  interventions.  July 2000 he had optional diagonal stenting by Dr. Allyson Sabal.  March 19, 1999 he had native LAD proximal stenting and diagonal PTCA.  He  subsequently had mid and distal CYPHER stenting of SVG to several marginal  branches by Dr. Elsie Lincoln on April 01, 2003.  He is admitted now with a  history of depression, recent [facility] admission, total remote  bilateral knee replacement, 20-30 pound weight loss, and chest pain.  He was  referred for coronary and visceral angiography in this setting.  There is  also a history of remote polypectomy, January 2005, and gout prior podagra.   DESCRIPTION OF PROCEDURE:  The patient was brought to the second floor CP  lab in the post absorptive state after 5 mg of Valium  p.o. medication.  He  was hydrated  preoperatively.  Creatinine was 1.1.  One percent Xylocaine was  used for local anesthesia and the common right femoral artery was entered  with a single anterior puncture using an 18 thin-walled needle and a 6  French short sidearm sheath was inserted without difficulty.  Coronary  angiography was done with 6 French 4 cm taper preformed coronary catheters.  Multipurpose catheter was used for SVG to RCA and a left coronary bypass  catheter was used for SVG to diagonal.  Subselective LIMA was done with the  right coronary catheter along with left subclavian angiography and pullback  pressures.  LV angiogram was done in the RAO and LAO projections with 25 mL  at 14 mL per second and 20 mL at 12 mL per second respectively.  Abdominal  angiogram was done above and below the levels of the renal arteries with 30  mL at 20 mL per second and another injection in the lateral projection at  the same setting.   Catheters were  removed.  Sidearm sheath was flushed.  The patient was taken  to the holding area for sheath removal and pressure hemostasis in stable  condition.  He tolerated the procedure well.   PRESSURES:  LV 140/0.  LVEDP 18 mmHg.  Central aorta 140/80 mmHg.  There is  no gradient across the aortic valve on catheter pullback.   FLUOROSCOPIC DATA:  Fluoroscopy revealed 2+ right and left coronary  calcification.  No significant intracardiac or valvular calcification.   ANGIOGRAPHIC DATA:  Left Ventricular Angiogram:  LV angiogram demonstrated a  small area of hypokinesis of the posterior apical and the basilar inferior  wall, otherwise excellent contraction with EF greater than 55%.  Angiographic LVH was present.  There was no MR.   Abdominal Angiogram:  Abdominal angiogram showed single left renal artery  with 50-60% narrowing and good residual lumen.  Single right renal artery  with irregularities and no significant narrowing,   The celiac and superior mesenteric artery were widely  patent with no  stenosis as were there proximal branches.   The IMA was widely patent with no stenosis.  There was mild infrarenal  atherosclerotic aortic disease, but no aneurysm or stenosis.  The iliac  system was tortuous, but widely patent.   The left subclavian showed mild tortuosity, but no significant stenosis of  approximately 20-30% before the left vertebral, which was smooth and no  transstenotic gradient on catheter pullback.   The main left coronary artery had calcification without high-grade stenosis.   The circumflex artery was totally occluded after a very small marginal and  atrial branch.   The native LAD was irregular with 40% segmental narrowing in the proximal  third, but good residual lumen.  The LAD stent around the bend was widely  patent with approximately 40% in the proximal third, 20% in the mid and  distal portions with excellent flow.  The LAD coursed the apex of the heart  where it bifurcated with no significant stenosis.  The diagonal branch  beyond the stent was of mild-to-moderate size, and had 70-80% stenosis with  good flow.   The native right coronary was totally occluded.   Saphenous vein graft to the distal RCA was widely patent with an excellent  anastomosis to the PDA and retrograde filling of the PLA.  There was 90%  segmental stenosis in the proximal PLA that is unchanged.   The saphenous vein graft to three marginals and distal circumflex branches  was widely patent with mild irregularities and no significant stenosis  within the proximal and the mid previously placed CYPHER stents.  The first  OM insertion was widely patent.  The next OM insertion had an 80-90%  narrowing, but good flow and this arose within the stent as did the more  proximal OM.  There was another distal small OM that was patent and the  distal circumflex was large and widely patent with retrograde filling to the circumflex though the graft. Saphenous vein graft to the  diagonal was patent  with 50% concentric narrowing in the midportion and 40-50% segmental  concentric narrowing at the junction of the mid and distal thirds.  There  was good residual lumen and good flow with excellent anastomosis to the  diagonal branch.   The LIMA was atretic, which was known from prior study and seen on this  occasion.   The patient __________ as outlined above.  He has excellent stents to his  proximal and mid SVG to multiple marginals and  distal circumflex.  Good  graft to his right and good graft to his diagonal with  50-60% narrowing,  but good residual lumen and widely patent stent to his  LAD.  I would  recommend medical therapy.  The etiology of his chest pain is not clear in  this setting.  He might be a candidate for EECP.  The etiology of his weight  loss is not clear.  He does have widely celiac and mesenteric arteries as  outlined above.   CATHETERIZATION DIAGNOSES:  1. Atherosclerotic heart disease, chest pain and coronary disease as     outlined above, etiology not determined.  2. Probable gastroesophageal reflux disease.  3. Recent weight loss  under investigation.  4. Depression.  5. Bilateral total knee replacements.  6. Status post multivessel coronary artery bypass graft, December 09, 1992.  7. Status post optional diagonal stent, February 27, 1999.  8. Status post left anterior descending native proximal stent and diagonal     percutaneous transluminal coronary angioplasty, March 19, 1999; no     restenosis.  9. Status post saphenous vein graft, obtuse marginal, obtuse marginal,     obtuse marginal distal circumflex, proximal and mid stenting, April 01, 2003, widely patent on this study.  10.      Well-preserved left ventricular function.  11.      Hyperlipidemia.                                               Richard A. Alanda Amass, M.D.    RAW/MEDQ  D:  10/21/2003  T:  10/22/2003  Job:  [id number]   cc:   CP Laboratory   Althea Grimmer.  Luther Parody, M.D.  [address], [address]  [place]  [place] [address]  Fax: [phone]   Larina Earthly, M.D.  [address]  [place]  [place] [address]  Fax: [phone]   Petra Kuba, M.D.  [address]  [place]  [place] [address]  Fax: [phone]

## 2011-01-01 NOTE — Cardiovascular Report (Signed)
NAME:  Samuel Christensen, Samuel Christensen NO.:  [id number]   MEDICAL RECORD NO.:  [id number]          PATIENT TYPE:  INP   LOCATION:  2031                         FACILITY:  [facility]   PHYSICIAN:  Richard A. Alanda Amass, M.D.DATE OF BIRTH:  05/30/1925   DATE OF PROCEDURE:  06/26/2004  DATE OF DISCHARGE:                              CARDIAC CATHETERIZATION   PROCEDURE:  Retrograde central aortic catheterization, selective coronary  angiography via Judkins technique, saphenous vein graft angiography,  subselective LIMA, LV angiogram in RAO projection, selective left renal  angiogram, hand injection using limited dye with preoperative Mucomyst and  postoperative bicarb drip and hydration for known mild renal insufficiency,  Visipaque dye used throughout the procedure.  The patient was hydrated  preoperatively and premedicated with 5 mg Valium p.o. and given Mucomyst.  A  bicarb drip was ordered.  Admission creatinine was 1.9, follow up  creatinines were 1.6 after hospital hydration.  The patient was not on ACE  but was known to have borderline left renal artery stenosis at last cath of  March 2005 (50-60%).  The CFRA was entered with a single anterior puncture  using an 18 thin wall needle and a 6 French short sidearm sheath was  inserted without difficulty.  Visipaque dye was used throughout the  procedure and limited dye administration was attempted.  Following coronary  angiography, saphenous vein angiography was done by hand injection.  Subselective LIMA was done with a single hand injection.  The saphenous vein  graft to the diagonal required a 6 French left coronary bypass, the other  vein grafts were cannulated with a 6 [place] JR4 catheter.  LV angiogram was  done at 20 mL, 12 mL per second in the RAO projection only.  Pull back  pressures showing no gradient across the aortic valve.  Subselective LIMA  was done by hand injection showing an atretic LIMA and 50-60% kinking and  left  subclavian stenosis.  Abdominal angiogram was done by the level of the  renal arteries, a pigtail catheter by hand injection, but suggested high  grade left renal artery stenosis and selective left renal angiogram was  performed with the 6 French right coronary catheter demonstrating an 80 mmHg  gradient across the ostia and a 95% calcific ostial proximal stenosis.  There was approximately 30% right renal stenosis but limited views.  Because  of dye consideration, we did not want to proceed any further at this time.  The patient was stable and tolerated the procedure well.  He was brought to  the holding area for sheath removal and pressure hemostasis.  Renal function  will be followed carefully postoperatively, bicarb drip was initiated and  will be continued on Mucomyst.   PRESSURES:  LV:  160/0; LVEDP 18-22 mmHg.  CA:  160/85 mmHg.   There is no gradient across the aortic valve on catheter pull back.   Fluoroscopy showed 3+ calcification in the proximal native coronaries.   LV angiogram demonstrated diffuse hypokinesis of a mild degree with EF  approximately 50% in the RAO projection and mid inferior focal wall motion  abnormalities.  No MR was seen.  As mentioned, there was 95% ostial left  renal artery stenosis, calcific, with 80 mmHg gradient.   There was approximately 50-60% narrowing of the left subclavian with  tortuosity and kinking but there was good antegrade flow and the LIMA was  atretic as  noted before.   The main left coronary had irregularity and calcium and no significant  stenosis.   The proximal LAD had 30-40% narrowing followed by a previously placed stent  that had 30-40% narrowing but good flow and good luminal diameter.  There  was a diagonal branch beyond the stent that was a very thin, small branch,  that had 90% stenosis in the septal perforator.  The remainder of the LAD  had no significant stenosis, coursed to the apex where it bifurcated with  good  flow.   The circumflex artery was totally occluded just after a very small marginal  branch.   The right coronary artery was totally occluded proximally with no antegrade  flow.   The saphenous vein graft to the RCA had two anastomoses to two acute  marginal branches with good flow (RV branches) and an excellent anastomosis  to the PDA and a sequential anastomosis to the PLA.  There was 70-80%  narrowing near the insertion of the anastomosis to the PLA.  This was  unchanged from prior angiogram October 21, 2003.  The sequential saphenous vein  graft to OM-OM-distal circumflex was widely patent.  There were some minor  irregularities with 40% near ostial narrowing within the previously placed  stent and less than 10% narrowing in the mid portion of the graft in this  stented area.  There was good flow to all branches.  The saphenous vein  graft to the diagonal had a diffusely diseased appearance with 40-50%  narrowing proximally, 50-70% narrowing eccentrically in the mid portion  moderately segmental, and 40% narrowing.  The anastomosis to the diagonal  was excellent with good flow to a bifurcating moderate size diagonal branch.  This represented mild progression of disease in the mid body of the graft.  No thrombus was seen.   Samuel Christensen is a 77 year old white married father of two daughters and  grandfather.  He has exogenous obesity, bilateral knee replacement, sleep  apnea, hypertriglyceridemia, hyperlipidemia, asthma, and remote CABG by Dr.  Andrey Campanile in April 1994.  He has had multiple prior interventions and had  optional diagonal stent placed by Dr. Allyson Sabal in July 2000 and in August 2000,  had LAD proximal stenting with an AV stent and PTCA of the diagonal side  branch.  He underwent graft intervention by Dr. Elsie Lincoln in August 2004 with  stenting of the proximal and tandem stenting of the mid sequential marginal-  circumflex graft.  He has had a history of depression, GERD, upper  GI disease, and multiple medical problems as outlined above.  He was currently  admitted for a prolonged episode of chest pain associated with diaphoresis.  Myocardial infarction was ruled out by serial enzymes and EKGs.  Creatinine  came down from 1.9 to 1.6 with hydration.   The patient's anatomy, as far as his native coronary arteries,  multisequential graft to the circumflex and marginals, multisequential graft  to the artery marginals and distal right is unchanged.  He does have mild  progression of disease in the proximal third-mid junction of the SVG to the  diagonal, but this appeared stable and only minimally progressed.  He has  reason for discomfort in  that there is an unprotected small diagonal and  unprotected marginal branch with ostial disease.  The etiology of his chest  pain is not clear and cannot rule out coronary etiology.  He has also had  significant upper GI disease and depression and past history of poor diet  and anorexia.  This is probably related to depression and will improve with  medications.  He had patent celiac, SMA, and IMA on prior angiography of  October 21, 2003.   I recommend medical therapy for his coronary artery disease and close follow  up of his renal function.  He is a candidate for left renal artery  intervention with high grade subtotal left renal artery stenosis  representing significant progression of disease with a 95% ostial stenosis  in the setting of systemic hypertension, recurrent angina, and renal  insufficiency.   CATHETERIZATION DIAGNOSIS:  1.  Chest pain, etiology not determined.  2.  Multivessel coronary artery disease with CABG x 9, Dr. Andrey Campanile, December 09, 1992.      1.  Patent sequential SVG to AM-AM-PDA-PLA.      2.  Patent sequential SVG to OM-OM-OM-circumflex.      3.  Patent SVG to diagonal with disease as outlined above, 50-70%          proximal-mid.      4.  Patent LAD stent in 2000.      5.  Patent proximal and mid  tandem SVG to circumflex stents April 01, 2003, no significant restenosis.  3.  Severe left renal artery stenosis, progression of disease 95% with 80      mmHg gradient.  4.  Renal insufficiency as outlined above.  5.  Systemic hypertension.  6.  GERD.  7.  Clinical depression.  8.  Bilateral total knee replacement, remote.  9.  Hyperlipidemia.       RAW/MEDQ  D:  06/26/2004  T:  06/26/2004  Job:  [id number]   cc:   Larina Earthly, M.D.  [address]  [place]  [place] [address]  Fax: [phone]   Althea Grimmer. Luther Parody, M.D.  [address]  [place]  [place] [address]  Fax: [phone]   Petra Kuba, M.D.  [address]  [place]  [place] [address]  Fax: [phone]   Antonietta Breach, M.D.

## 2011-01-15 ENCOUNTER — Encounter: Payer: Self-pay | Admitting: Internal Medicine

## 2011-01-22 ENCOUNTER — Encounter: Payer: Self-pay | Admitting: Internal Medicine

## 2011-02-01 ENCOUNTER — Encounter: Payer: Self-pay | Admitting: Internal Medicine

## 2011-02-02 ENCOUNTER — Encounter: Payer: Medicare Other | Admitting: Internal Medicine

## 2011-02-18 ENCOUNTER — Encounter: Payer: Self-pay | Admitting: Internal Medicine

## 2011-03-05 ENCOUNTER — Encounter: Payer: Self-pay | Admitting: Internal Medicine

## 2011-04-08 ENCOUNTER — Encounter: Payer: Medicare Other | Admitting: Internal Medicine

## 2011-05-05 LAB — CBC
HCT: 33.5 — ABNORMAL LOW
HCT: 33.8 — ABNORMAL LOW
Hemoglobin: 11.6 — ABNORMAL LOW
Hemoglobin: 11.7 — ABNORMAL LOW
MCHC: 34.3
MCV: 92.6
RBC: 3.66 — ABNORMAL LOW
RDW: 12.7

## 2011-05-05 LAB — BASIC METABOLIC PANEL
BUN: 28 — ABNORMAL HIGH
CO2: 28
Chloride: 105
Chloride: 106
Creatinine, Ser: 1.61 — ABNORMAL HIGH
Creatinine, Ser: 1.62 — ABNORMAL HIGH
GFR calc Af Amer: 50 — ABNORMAL LOW
Potassium: 4.1

## 2011-05-05 LAB — PROTIME-INR: INR: 1.4

## 2011-05-05 LAB — HEMOGLOBIN A1C
Hgb A1c MFr Bld: 6.6 — ABNORMAL HIGH
Mean Plasma Glucose: 158

## 2011-05-06 ENCOUNTER — Encounter: Payer: Self-pay | Admitting: Internal Medicine

## 2011-05-21 LAB — CBC
HCT: 34.2 — ABNORMAL LOW
HCT: 34.7 — ABNORMAL LOW
HCT: 35.1 — ABNORMAL LOW
HCT: 40
Hemoglobin: 11.5 — ABNORMAL LOW
Hemoglobin: 11.6 — ABNORMAL LOW
Hemoglobin: 11.8 — ABNORMAL LOW
Hemoglobin: 11.9 — ABNORMAL LOW
Hemoglobin: 11.9 — ABNORMAL LOW
MCHC: 34
MCHC: 34
MCHC: 34.5
MCV: 91.9
MCV: 92.4
MCV: 92.7
MCV: 93.1
RBC: 3.42 — ABNORMAL LOW
RBC: 3.67 — ABNORMAL LOW
RBC: 3.74 — ABNORMAL LOW
RBC: 4.32
RDW: 12.7
RDW: 12.8
RDW: 12.8
RDW: 13.2
RDW: 13.4
WBC: 10.9 — ABNORMAL HIGH
WBC: 7.3
WBC: 7.4

## 2011-05-21 LAB — BASIC METABOLIC PANEL
BUN: 43 — ABNORMAL HIGH
CO2: 25
CO2: 28
CO2: 29
Calcium: 8.8
Calcium: 8.9
Calcium: 9
Chloride: 103
Chloride: 107
Creatinine, Ser: 2.04 — ABNORMAL HIGH
Creatinine, Ser: 2.76 — ABNORMAL HIGH
Creatinine, Ser: 3.33 — ABNORMAL HIGH
GFR calc Af Amer: 27 — ABNORMAL LOW
GFR calc Af Amer: 51 — ABNORMAL LOW
GFR calc non Af Amer: 42 — ABNORMAL LOW
GFR calc non Af Amer: 42 — ABNORMAL LOW
Glucose, Bld: 128 — ABNORMAL HIGH
Glucose, Bld: 131 — ABNORMAL HIGH
Glucose, Bld: 137 — ABNORMAL HIGH
Glucose, Bld: 151 — ABNORMAL HIGH
Glucose, Bld: 156 — ABNORMAL HIGH
Potassium: 3.4 — ABNORMAL LOW
Potassium: 3.8
Potassium: 4
Sodium: 135
Sodium: 138
Sodium: 140
Sodium: 142

## 2011-05-21 LAB — BLOOD GAS, ARTERIAL
Acid-Base Excess: 0.5
Acid-Base Excess: 4.8 — ABNORMAL HIGH
Drawn by: 284591
FIO2: 0.4
MECHVT: 600
MECHVT: 600
MECHVT: 600
O2 Saturation: 93.7
Patient temperature: 99.3
RATE: 14
RATE: 14
TCO2: 26.9
pCO2 arterial: 39.8
pCO2 arterial: 41.8
pH, Arterial: 7.393
pH, Arterial: 7.425
pO2, Arterial: 87.1

## 2011-05-21 LAB — POCT I-STAT 3, ART BLOOD GAS (G3+)
Bicarbonate: 21.8
Operator id: 281201
pCO2 arterial: 39.6
pH, Arterial: 7.349 — ABNORMAL LOW
pO2, Arterial: 116 — ABNORMAL HIGH

## 2011-05-21 LAB — DIFFERENTIAL
Basophils Absolute: 0.1
Basophils Relative: 1
Eosinophils Relative: 0
Lymphocytes Relative: 15
Monocytes Absolute: 1.1 — ABNORMAL HIGH
Neutro Abs: 8 — ABNORMAL HIGH

## 2011-05-21 LAB — URINE CULTURE

## 2011-05-21 LAB — CARDIAC PANEL(CRET KIN+CKTOT+MB+TROPI)
Relative Index: 0.9
Relative Index: 2.2
Troponin I: 0.34 — ABNORMAL HIGH
Troponin I: 0.48 — ABNORMAL HIGH

## 2011-05-21 LAB — CULTURE, BLOOD (ROUTINE X 2): Culture: NO GROWTH

## 2011-05-21 LAB — CULTURE, BAL-QUANTITATIVE W GRAM STAIN

## 2011-05-21 LAB — COMPREHENSIVE METABOLIC PANEL
AST: 55 — ABNORMAL HIGH
Alkaline Phosphatase: 52
BUN: 25 — ABNORMAL HIGH
CO2: 25
Chloride: 105
Creatinine, Ser: 3.14 — ABNORMAL HIGH
GFR calc non Af Amer: 19 — ABNORMAL LOW
Potassium: 4.4
Total Bilirubin: 0.7

## 2011-05-21 LAB — TSH: TSH: 2.209

## 2011-05-21 LAB — MAGNESIUM: Magnesium: 1.6

## 2011-11-15 ENCOUNTER — Encounter (HOSPITAL_COMMUNITY): Payer: Self-pay | Admitting: Emergency Medicine

## 2011-11-15 ENCOUNTER — Other Ambulatory Visit: Payer: Self-pay

## 2011-11-15 ENCOUNTER — Inpatient Hospital Stay (HOSPITAL_COMMUNITY)
Admission: EM | Admit: 2011-11-15 | Discharge: 2011-11-25 | DRG: 287 | Disposition: A | Discharge status: Home / Self Care | Payer: Medicare Other | Attending: Internal Medicine | Admitting: Internal Medicine

## 2011-11-15 ENCOUNTER — Emergency Department (HOSPITAL_COMMUNITY): Payer: Medicare Other

## 2011-11-15 DIAGNOSIS — I129 Hypertensive chronic kidney disease with stage 1 through stage 4 chronic kidney disease, or unspecified chronic kidney disease: Secondary | ICD-10-CM | POA: Diagnosis present

## 2011-11-15 DIAGNOSIS — I2510 Atherosclerotic heart disease of native coronary artery without angina pectoris: Secondary | ICD-10-CM | POA: Diagnosis present

## 2011-11-15 DIAGNOSIS — Z950 Presence of cardiac pacemaker: Secondary | ICD-10-CM

## 2011-11-15 DIAGNOSIS — M109 Gout, unspecified: Secondary | ICD-10-CM | POA: Diagnosis not present

## 2011-11-15 DIAGNOSIS — I2699 Other pulmonary embolism without acute cor pulmonale: Secondary | ICD-10-CM | POA: Diagnosis present

## 2011-11-15 DIAGNOSIS — I10 Essential (primary) hypertension: Secondary | ICD-10-CM | POA: Diagnosis present

## 2011-11-15 DIAGNOSIS — I5189 Other ill-defined heart diseases: Secondary | ICD-10-CM | POA: Diagnosis present

## 2011-11-15 DIAGNOSIS — J440 Chronic obstructive pulmonary disease with acute lower respiratory infection: Secondary | ICD-10-CM | POA: Diagnosis present

## 2011-11-15 DIAGNOSIS — G4730 Sleep apnea, unspecified: Secondary | ICD-10-CM | POA: Diagnosis present

## 2011-11-15 DIAGNOSIS — I2589 Other forms of chronic ischemic heart disease: Secondary | ICD-10-CM | POA: Diagnosis present

## 2011-11-15 DIAGNOSIS — I2582 Chronic total occlusion of coronary artery: Secondary | ICD-10-CM | POA: Diagnosis present

## 2011-11-15 DIAGNOSIS — Z7901 Long term (current) use of anticoagulants: Secondary | ICD-10-CM

## 2011-11-15 DIAGNOSIS — I25810 Atherosclerosis of coronary artery bypass graft(s) without angina pectoris: Secondary | ICD-10-CM | POA: Diagnosis present

## 2011-11-15 DIAGNOSIS — R001 Bradycardia, unspecified: Secondary | ICD-10-CM | POA: Diagnosis not present

## 2011-11-15 DIAGNOSIS — E785 Hyperlipidemia, unspecified: Secondary | ICD-10-CM | POA: Diagnosis present

## 2011-11-15 DIAGNOSIS — Z95810 Presence of automatic (implantable) cardiac defibrillator: Secondary | ICD-10-CM | POA: Diagnosis present

## 2011-11-15 DIAGNOSIS — Z9862 Peripheral vascular angioplasty status: Secondary | ICD-10-CM

## 2011-11-15 DIAGNOSIS — D696 Thrombocytopenia, unspecified: Secondary | ICD-10-CM | POA: Diagnosis present

## 2011-11-15 DIAGNOSIS — I509 Heart failure, unspecified: Secondary | ICD-10-CM

## 2011-11-15 DIAGNOSIS — E876 Hypokalemia: Secondary | ICD-10-CM | POA: Insufficient documentation

## 2011-11-15 DIAGNOSIS — E119 Type 2 diabetes mellitus without complications: Secondary | ICD-10-CM | POA: Diagnosis present

## 2011-11-15 DIAGNOSIS — I255 Ischemic cardiomyopathy: Secondary | ICD-10-CM | POA: Diagnosis present

## 2011-11-15 DIAGNOSIS — I513 Intracardiac thrombosis, not elsewhere classified: Secondary | ICD-10-CM | POA: Clinically undetermined

## 2011-11-15 DIAGNOSIS — N184 Chronic kidney disease, stage 4 (severe): Secondary | ICD-10-CM | POA: Diagnosis present

## 2011-11-15 DIAGNOSIS — I5023 Acute on chronic systolic (congestive) heart failure: Principal | ICD-10-CM | POA: Diagnosis present

## 2011-11-15 DIAGNOSIS — I2782 Chronic pulmonary embolism: Secondary | ICD-10-CM | POA: Diagnosis present

## 2011-11-15 DIAGNOSIS — J9610 Chronic respiratory failure, unspecified whether with hypoxia or hypercapnia: Secondary | ICD-10-CM | POA: Diagnosis present

## 2011-11-15 DIAGNOSIS — Z9981 Dependence on supplemental oxygen: Secondary | ICD-10-CM

## 2011-11-15 DIAGNOSIS — J209 Acute bronchitis, unspecified: Secondary | ICD-10-CM | POA: Diagnosis present

## 2011-11-15 HISTORY — DX: Chronic respiratory failure, unspecified whether with hypoxia or hypercapnia: J96.10

## 2011-11-15 HISTORY — DX: Type 2 diabetes mellitus without complications: E11.9

## 2011-11-15 HISTORY — DX: Major depressive disorder, single episode, unspecified: F32.9

## 2011-11-15 HISTORY — DX: Acute embolism and thrombosis of unspecified deep veins of lower extremity, bilateral: I82.403

## 2011-11-15 HISTORY — DX: Intracardiac thrombosis, not elsewhere classified: I51.3

## 2011-11-15 HISTORY — DX: Chronic kidney disease, unspecified: N18.9

## 2011-11-15 HISTORY — DX: Presence of cardiac pacemaker: Z95.0

## 2011-11-15 HISTORY — DX: Gout, unspecified: M10.9

## 2011-11-15 HISTORY — DX: Pneumonia, unspecified organism: J18.9

## 2011-11-15 HISTORY — DX: Acute myocardial infarction, unspecified: I21.9

## 2011-11-15 HISTORY — DX: Acute bronchitis, unspecified: J20.9

## 2011-11-15 HISTORY — DX: Presence of automatic (implantable) cardiac defibrillator: Z95.810

## 2011-11-15 HISTORY — DX: Unspecified osteoarthritis, unspecified site: M19.90

## 2011-11-15 HISTORY — DX: Depression, unspecified: F32.A

## 2011-11-15 HISTORY — DX: Angina pectoris, unspecified: I20.9

## 2011-11-15 HISTORY — DX: Peripheral vascular disease, unspecified: I73.9

## 2011-11-15 HISTORY — DX: Gastro-esophageal reflux disease without esophagitis: K21.9

## 2011-11-15 HISTORY — DX: Shortness of breath: R06.02

## 2011-11-15 HISTORY — DX: Thrombocytopenia, unspecified: D69.6

## 2011-11-15 HISTORY — DX: Peripheral vascular angioplasty status: Z98.62

## 2011-11-15 LAB — DIFFERENTIAL
Eosinophils Relative: 0 % (ref 0–5)
Lymphocytes Relative: 28 % (ref 12–46)
Lymphs Abs: 2 10*3/uL (ref 0.7–4.0)
Monocytes Absolute: 0.6 10*3/uL (ref 0.1–1.0)
Monocytes Relative: 8 % (ref 3–12)

## 2011-11-15 LAB — CARDIAC PANEL(CRET KIN+CKTOT+MB+TROPI)
CK, MB: 1.8 ng/mL (ref 0.3–4.0)
Relative Index: INVALID (ref 0.0–2.5)
Total CK: 48 U/L (ref 7–232)
Total CK: 44 U/L (ref 7–232)
Troponin I: <0.3 ng/mL (ref <0.30)

## 2011-11-15 LAB — BASIC METABOLIC PANEL
CO2: 31 mEq/L (ref 19–32)
Calcium: 9.2 mg/dL (ref 8.4–10.5)
Chloride: 93 mEq/L — ABNORMAL LOW (ref 96–112)
Creatinine, Ser: 0.98 mg/dL (ref 0.50–1.35)
Glucose, Bld: 100 mg/dL — ABNORMAL HIGH (ref 70–99)

## 2011-11-15 LAB — CBC
HCT: 44.9 % (ref 39.0–52.0)
Hemoglobin: 15.3 g/dL (ref 13.0–17.0)
MCV: 92.8 fL (ref 78.0–100.0)
RBC: 4.84 MIL/uL (ref 4.22–5.81)
WBC: 7.1 10*3/uL (ref 4.0–10.5)

## 2011-11-15 LAB — PROTIME-INR: INR: 1.84 — ABNORMAL HIGH (ref 0.00–1.49)

## 2011-11-15 MED ORDER — INSULIN DETEMIR 100 UNIT/ML ~~LOC~~ SOLN
20.0000 [IU] | Freq: Every day | SUBCUTANEOUS | Status: DC
  Administered 2011-11-15 – 2011-11-24 (×10): 20 [IU] via SUBCUTANEOUS
  Filled 2011-11-15: qty 10

## 2011-11-15 MED ORDER — FUROSEMIDE 10 MG/ML IJ SOLN
40.0000 mg | Freq: Once | INTRAMUSCULAR | Status: AC
  Administered 2011-11-15: 40 mg via INTRAVENOUS
  Filled 2011-11-15: qty 4

## 2011-11-15 MED ORDER — SODIUM CHLORIDE 0.9 % IJ SOLN
3.0000 mL | INTRAMUSCULAR | Status: DC | PRN
  Administered 2011-11-21: 3 mL via INTRAVENOUS

## 2011-11-15 MED ORDER — ESCITALOPRAM OXALATE 20 MG PO TABS
20.0000 mg | ORAL_TABLET | Freq: Every day | ORAL | Status: DC
  Administered 2011-11-15 – 2011-11-25 (×11): 20 mg via ORAL
  Filled 2011-11-15 (×11): qty 1

## 2011-11-15 MED ORDER — POTASSIUM CHLORIDE CRYS ER 20 MEQ PO TBCR
20.0000 meq | EXTENDED_RELEASE_TABLET | Freq: Once | ORAL | Status: AC
  Administered 2011-11-15: 20 meq via ORAL
  Filled 2011-11-15: qty 1

## 2011-11-15 MED ORDER — DIGOXIN 125 MCG PO TABS
125.0000 ug | ORAL_TABLET | Freq: Every day | ORAL | Status: DC
  Administered 2011-11-15 – 2011-11-25 (×11): 125 ug via ORAL
  Filled 2011-11-15 (×11): qty 1

## 2011-11-15 MED ORDER — WARFARIN SODIUM 7.5 MG PO TABS
7.5000 mg | ORAL_TABLET | Freq: Once | ORAL | Status: AC
  Administered 2011-11-15: 7.5 mg via ORAL
  Filled 2011-11-15 (×2): qty 1

## 2011-11-15 MED ORDER — HYDRALAZINE HCL 25 MG PO TABS
25.0000 mg | ORAL_TABLET | Freq: Three times a day (TID) | ORAL | Status: DC
  Administered 2011-11-15 – 2011-11-25 (×29): 25 mg via ORAL
  Filled 2011-11-15 (×32): qty 1

## 2011-11-15 MED ORDER — SODIUM CHLORIDE 0.9 % IV SOLN: 250.0000 mL | INTRAVENOUS | Status: DC | PRN

## 2011-11-15 MED ORDER — GUAIFENESIN ER 600 MG PO TB12
600.0000 mg | ORAL_TABLET | Freq: Two times a day (BID) | ORAL | Status: DC
  Administered 2011-11-15 – 2011-11-25 (×20): 600 mg via ORAL
  Filled 2011-11-15 (×22): qty 1

## 2011-11-15 MED ORDER — WARFARIN - PHARMACIST DOSING INPATIENT
Freq: Every day | Status: DC
  Administered 2011-11-15: 18:00:00

## 2011-11-15 MED ORDER — ONDANSETRON HCL 4 MG/2ML IJ SOLN: 4.0000 mg | Freq: Four times a day (QID) | INTRAMUSCULAR | Status: DC | PRN

## 2011-11-15 MED ORDER — FAMOTIDINE 20 MG PO TABS
20.0000 mg | ORAL_TABLET | Freq: Two times a day (BID) | ORAL | Status: DC | PRN
  Filled 2011-11-15: qty 1

## 2011-11-15 MED ORDER — ATORVASTATIN CALCIUM 10 MG PO TABS
10.0000 mg | ORAL_TABLET | Freq: Every day | ORAL | Status: DC
  Administered 2011-11-15 – 2011-11-24 (×10): 10 mg via ORAL
  Filled 2011-11-15 (×12): qty 1

## 2011-11-15 MED ORDER — ASPIRIN EC 81 MG PO TBEC
81.0000 mg | DELAYED_RELEASE_TABLET | Freq: Every day | ORAL | Status: DC
  Administered 2011-11-15 – 2011-11-25 (×11): 81 mg via ORAL
  Filled 2011-11-15 (×11): qty 1

## 2011-11-15 MED ORDER — OMEGA-3-ACID ETHYL ESTERS 1 G PO CAPS
1.0000 g | ORAL_CAPSULE | Freq: Two times a day (BID) | ORAL | Status: DC
  Administered 2011-11-15 – 2011-11-25 (×20): 1 g via ORAL
  Filled 2011-11-15 (×23): qty 1

## 2011-11-15 MED ORDER — SODIUM CHLORIDE 0.9 % IJ SOLN
3.0000 mL | Freq: Two times a day (BID) | INTRAMUSCULAR | Status: DC
Start: 2011-11-15 — End: 2011-11-25
  Administered 2011-11-15 – 2011-11-25 (×15): 3 mL via INTRAVENOUS

## 2011-11-15 MED ORDER — FUROSEMIDE 10 MG/ML IJ SOLN
40.0000 mg | Freq: Two times a day (BID) | INTRAMUSCULAR | Status: DC
  Administered 2011-11-15 – 2011-11-17 (×4): 40 mg via INTRAVENOUS
  Filled 2011-11-15 (×7): qty 4

## 2011-11-15 MED ORDER — METOPROLOL TARTRATE 25 MG PO TABS
25.0000 mg | ORAL_TABLET | Freq: Every day | ORAL | Status: DC
  Administered 2011-11-15 – 2011-11-25 (×11): 25 mg via ORAL
  Filled 2011-11-15 (×11): qty 1

## 2011-11-15 MED ORDER — POTASSIUM CHLORIDE CRYS ER 20 MEQ PO TBCR
20.0000 meq | EXTENDED_RELEASE_TABLET | Freq: Every day | ORAL | Status: DC
  Administered 2011-11-15 – 2011-11-18 (×4): 20 meq via ORAL
  Filled 2011-11-15 (×4): qty 1

## 2011-11-15 MED ORDER — AZITHROMYCIN 500 MG PO TABS
500.0000 mg | ORAL_TABLET | Freq: Every day | ORAL | Status: AC
  Administered 2011-11-15: 500 mg via ORAL
  Filled 2011-11-15 (×2): qty 1

## 2011-11-15 MED ORDER — AZITHROMYCIN 250 MG PO TABS
250.0000 mg | ORAL_TABLET | Freq: Every day | ORAL | Status: AC
  Administered 2011-11-16 – 2011-11-19 (×4): 250 mg via ORAL
  Filled 2011-11-15 (×4): qty 1

## 2011-11-15 MED ORDER — ACETAMINOPHEN 325 MG PO TABS: 650.0000 mg | ORAL_TABLET | ORAL | Status: DC | PRN

## 2011-11-15 MED ORDER — INSULIN ASPART 100 UNIT/ML ~~LOC~~ SOLN
0.0000 [IU] | Freq: Three times a day (TID) | SUBCUTANEOUS | Status: DC
  Administered 2011-11-16 – 2011-11-19 (×2): 2 [IU] via SUBCUTANEOUS
  Administered 2011-11-20: 5 [IU] via SUBCUTANEOUS
  Administered 2011-11-21 – 2011-11-24 (×5): 3 [IU] via SUBCUTANEOUS

## 2011-11-15 MED ORDER — NIACIN 500 MG PO TABS
500.0000 mg | ORAL_TABLET | Freq: Every day | ORAL | Status: DC
  Administered 2011-11-15 – 2011-11-24 (×10): 500 mg via ORAL
  Filled 2011-11-15 (×11): qty 1

## 2011-11-15 MED ORDER — FENOFIBRATE 160 MG PO TABS
160.0000 mg | ORAL_TABLET | Freq: Every day | ORAL | Status: DC
  Administered 2011-11-15 – 2011-11-25 (×11): 160 mg via ORAL
  Filled 2011-11-15 (×11): qty 1

## 2011-11-15 NOTE — Progress Notes (Signed)
ANTICOAGULATION CONSULT NOTE - Initial Consult  Pharmacy Consult for Coumadin Indication: pulmonary embolus (history)  Allergies  Allergen Reactions  . Motrin (Ibuprofen) Other (See Comments)    Put hole in stomach    Patient Measurements:   Weight = 94.8 kg  Vital Signs: Temp: 97.9 F (36.6 C) (04/01 1210) BP: 124/63 mmHg (04/01 1210) Pulse Rate: 70  (04/01 1210)  Labs:  Basename 11/15/11 1316 11/15/11 1315  HGB -- 15.3  HCT -- 44.9  PLT -- 104*  APTT -- --  LABPROT -- 21.6*  INR -- 1.84*  HEPARINUNFRC -- --  CREATININE -- 0.98  CKTOTAL 48 --  CKMB 1.7 --  TROPONINI <0.30 --   The CrCl is unknown because both a height and weight (above a minimum accepted value) are required for this calculation.  Medical History: Past Medical History  Diagnosis Date  . Renal anomaly     renal insufficiency, concerened about contrast nephropathy   . Morbid obesity   . Sleep apnea   . CAD (coronary artery disease)     prior bypass surgery  . CHF (congestive heart failure)     chroinc, systolic  . Heart block     complete heart block s/p pacemaker implantation  . Pulmonary embolism     recurrent  . HTN (hypertension)   . Dyslipidemia   . Diabetes mellitus     type not specified.     Medications:  Coumadin 5mg  daily PTA - last dose 3/31  Assessment: 78 yo M on Coumadin PTA for hx of PE.  Pt presents to ER with 2 week hx of SOB, 2-pillow orthopnea, dizziness, PND, LE edema.  Plan for gentle diuresis for HF exacerbation.  To continue anticoagulation per pharmacy protocol.  Will increase Coumadin dose tonight with subtherapeutic INR.   Goal of Therapy:  INR 2-3   Plan:  Coumadin 7.5mg  PO x 1 tonight. Daily INR.  [employer], Pharm.D., BCPS Clinical Pharmacist Pager [phone] 11/15/2011 3:06 PM

## 2011-11-15 NOTE — H&P (Signed)
Samuel Christensen is an 78 y.o. male.   Chief Complaint: shortness of breath for 2 weeks HPI: The patient is an 78 year old Caucasian male who is accompanied by his wife, who gives a large amount of the history.  His history is significant significant for coronary artery disease status post remote coronary bypass grafting. His last heart catheterization was in July 2011 which showed a patent sequential RCA graft to the PDA and PLA and a thrombus and TIMI I to 2 flow which was treated medically. He has an occluded SVG to the circumflex and occluded SVG to diagonal. Patient also has a by the pacemaker upgraded in March of 2012. Patient said subsequent improvement in ejection fraction from 25-35% up to now 40-45%. Patient also has a history of obstructive sleep apnea COPD, ischemic cardiomyopathy, pulmonary embolism for which he takes Coumadin, chronic venous insufficiency, hypertension, dyslipidemia, diabetes mellitus.  The patient presents with 2 weeks of shortness of breath which is getting progressively worse. He has two-pillow orthopnea, dizziness, PND, lower extremity edema, chest pain for 2-3 times per day she appears worse with exertion cough, congestion. Patient apparently is coughing up green "chunks".  The patient's had some nausea and vomiting on Thursday and Friday last week. He's also had decreased appetite for that time period and has been very thirsty and drinking water all day long.  He denies any fever, abdominal pain, dysuria, hematuria, hematochezia, melena.  Past Medical History  Diagnosis Date  . Renal anomaly     renal insufficiency, concerened about contrast nephropathy   . Morbid obesity   . Sleep apnea   . CAD (coronary artery disease)     prior bypass surgery  . CHF (congestive heart failure)     chroinc, systolic  . Heart block     complete heart block s/p pacemaker implantation  . Pulmonary embolism     recurrent  . HTN (hypertension)   . Dyslipidemia   . Diabetes mellitus      type not specified.     Past Surgical History  Procedure Date  . Coronary artery bypass graft     x9 in 1991, we are still trying to obtain the repot of this operation.   Marland Kitchen Permanent pacemaker     ppm- [employer]. Hx of it in 2008, when he had bradychardic arrest requiring intubation secondary to complete heart block and in early Jan. 2009, had a revision of the pacer lead, he has had bilateral knee placements.     Family History  Problem Relation Age of Onset  . Heart failure Neg Hx    Social History:  has an unknown smoking status. He does not have any smokeless tobacco history on file. He reports that he does not drink alcohol. His drug history not on file.  Allergies:  Allergies  Allergen Reactions  . Motrin (Ibuprofen) Other (See Comments)    Put hole in stomach    No current facility-administered medications on file as of 11/15/2011.   Medications Prior to Admission  Medication Sig Dispense Refill  . digoxin (LANOXIN) 0.125 MG tablet Take 125 mcg by mouth daily.        Marland Kitchen escitalopram (LEXAPRO) 20 MG tablet Take 20 mg by mouth daily.        . famotidine (PEPCID) 20 MG tablet Take 20 mg by mouth 2 (two) times daily as needed. For reflux.      . furosemide (LASIX) 40 MG tablet Take 40 mg by mouth daily.        Marland Kitchen  hydrALAZINE (APRESOLINE) 25 MG tablet Take 25 mg by mouth 3 (three) times daily. 1 1/2 every 8 hours       . insulin detemir (LEVEMIR) 100 UNIT/ML injection Inject 20 Units into the skin at bedtime.      . metoprolol tartrate (LOPRESSOR) 25 MG tablet Take 25 mg by mouth daily.        . Multiple Vitamins-Minerals (CENTRUM SILVER PO) Take by mouth daily.        . niacin 500 MG tablet Take 500 mg by mouth at bedtime. 2 po at bedtime       . Omega-3 Fatty Acids (FISH OIL) 1000 MG CAPS Take 1,000 mg by mouth daily. 2 po daily       . potassium chloride (MICRO-K) 10 MEQ CR capsule Take 10 mEq by mouth daily. Take only Monday -friday       . rosuvastatin (CRESTOR)  5 MG tablet Take 5 mg by mouth daily.        Marland Kitchen warfarin (COUMADIN) 5 MG tablet Take 5 mg by mouth daily.         Results for orders placed during the hospital encounter of 11/15/11 (from the past 48 hour(s))  CBC     Status: Normal (Preliminary result)   Collection Time   11/15/11  1:15 PM      Component Value Range Comment   WBC 7.1  4.0 - 10.5 (K/uL)    RBC 4.84  4.22 - 5.81 (MIL/uL)    Hemoglobin 15.3  13.0 - 17.0 (g/dL)    HCT 46.9  62.9 - 52.8 (%)    MCV 92.8  78.0 - 100.0 (fL)    MCH 31.6  26.0 - 34.0 (pg)    MCHC 34.1  30.0 - 36.0 (g/dL)    RDW 41.3  24.4 - 01.0 (%)    Platelets PENDING  150 - 400 (K/uL)   DIFFERENTIAL     Status: Normal   Collection Time   11/15/11  1:15 PM      Component Value Range Comment   Neutrophils Relative 64  43 - 77 (%)    Neutro Abs 4.5  1.7 - 7.7 (K/uL)    Lymphocytes Relative 28  12 - 46 (%)    Lymphs Abs 2.0  0.7 - 4.0 (K/uL)    Monocytes Relative 8  3 - 12 (%)    Monocytes Absolute 0.6  0.1 - 1.0 (K/uL)    Eosinophils Relative 0  0 - 5 (%)    Eosinophils Absolute 0.0  0.0 - 0.7 (K/uL)    Basophils Relative 1  0 - 1 (%)    Basophils Absolute 0.1  0.0 - 0.1 (K/uL)    Dg Chest 2 View  11/15/2011  *RADIOLOGY REPORT*  Clinical Data: Chest pain. Shortness of breath.  CHEST - 2 VIEW  Comparison: 12/22/2010.  Findings: Biventricular pacer is in place with leads unchanged in position.  Cardiomegaly.  Central pulmonary vascular prominence. Small right-sided pleural effusion.  Calcified aorta.  Prominent nipple shadows.  Mild wedge compression deformity mid thoracic vertebra without change.  IMPRESSION: Cardiomegaly with pulmonary vascular congestion and small right- sided pleural effusion.  Original Report Authenticated By: Fuller Canada, M.D.    ROS  Blood pressure 124/63, pulse 70, temperature 97.9 F (36.6 C), resp. rate 18, SpO2 87.00%. Physical Exam  Gen., an 78 year old male in no apparent stress. Are equal round reactive light accommodation,  extraocular movements are intact. Negative scleral icterus. His neck is  nontender negative cervical lymphadenopathy, positive JVD. Heart is regular rate and rhythm without murmurs rubs or gallops. Lungs: Bilateral rhonchi, dullness to percussion particularly on the right base, E-A changes throughout. Abdomen is soft and nontender positive bowel sounds in all quadrants. There is a 2+ radial pulses 2+ right dorsalis pedis 1+ left dorsalis pedis. Trace lower extremity edema. Negative carotid or femoral bruits. Skin is warm and dry.  Assessment/Plan Patient Active Hospital Problem List: Chest Pain ACUTE ON CHRONIC SYSTOLIC HEART FAILURE (10/13/2010) CHRONIC OBSTRUCTIVE PULMONARY DISEASE, OXYGEN DEPENDENT (10/13/2010) DM (09/04/2010) DYSLIPIDEMIA (09/04/2010) Morbid obesity (09/04/2010) HYPERTENSION (09/04/2010) SLEEP APNEA (09/04/2010) CARDIOMYOPATHY, ISCHEMIC (10/13/2010) Chronic pulmonary embolism (10/13/2010) RENAL DISEASE, CHRONIC, STAGE IV (10/13/2010) PACEMAKER, PERMANENT (10/13/2010)  PLAN: 78 year old male with history of ischemic cardiomyopathy and a BNP  of 5999 percent shortness of breath that has been progressively getting worse for the last 2 weeks.  We will admit to telemetry. We'll start him on IV diuretics.  You may also be having a COPD exacerbation with chronic bronchitis which is producing green mucus.  The patient is afebrile with a white count WNL.  I will start him on guaifenesin to help loosen up congestion and Xopenex breathing treatments.  We'll start low sodium diet. We'll continue to cycle cardiac enzymes.  The initial ones were negative.  The patient also has a history of a pulmonary embolism and does have he subtherapeutic INR of 1.84.  I suppose there is a potential for recurrent pulmonary embolism, however, the patient is resting comfortably, he is not tachypneic nor tachycardic.  Continue Coumadin therapy per pharmacy consult. He is hypokalemic slightly so we'll replete.     Novalee Horsfall W 11/15/2011, 1:45 PM

## 2011-11-15 NOTE — ED Provider Notes (Signed)
History     CSN: [id number]  Arrival date & time 11/15/11  1205   First MD Initiated Contact with Patient 11/15/11 1246      Chief Complaint  Patient presents with  . Shortness of Breath    (Consider location/radiation/quality/duration/timing/severity/associated sxs/prior treatment) HPI Comments: Pt presents with increasing SOB over the last few days.  Some productive cough, but no fevers.  Wife notes some orthopnea at home.  Taking all his meds.  No leg swelling.  No chest pain.  Pt has had his home O2 requirement increased recently from 3 to 4 L [place].  Pt is slow to answer questions so his wife provides most of the history.  Pt called the cardiology office and they told him to come to the ED.  Cardiology already at bedside to see and  eval the pt concurrently.    Patient is a 78 y.o. male presenting with shortness of breath. The history is provided by the patient, the spouse and a relative. No language interpreter was used.  Shortness of Breath  The current episode started 3 to 5 days ago. The onset was gradual. The problem occurs continuously. The problem has been gradually worsening. The problem is moderate. The symptoms are relieved by rest. The symptoms are aggravated by activity. Associated symptoms include orthopnea, cough and shortness of breath. Pertinent negatives include no chest pain, no chest pressure, no fever, no rhinorrhea, no sore throat and no wheezing.    Past Medical History  Diagnosis Date  . Renal anomaly     renal insufficiency, concerened about contrast nephropathy   . Morbid obesity   . Sleep apnea   . CAD (coronary artery disease)     prior bypass surgery  . CHF (congestive heart failure)     chroinc, systolic  . Heart block     complete heart block s/p pacemaker implantation  . Pulmonary embolism     recurrent  . HTN (hypertension)   . Dyslipidemia   . Diabetes mellitus     type not specified.     Past Surgical History  Procedure Date  . Coronary  artery bypass graft     x9 in 1991, we are still trying to obtain the repot of this operation.   Marland Kitchen Permanent pacemaker     ppm- [employer]. Hx of it in 2008, when he had bradychardic arrest requiring intubation secondary to complete heart block and in early Jan. 2009, had a revision of the pacer lead, he has had bilateral knee placements.     Family History  Problem Relation Age of Onset  . Heart failure Neg Hx     History  Substance Use Topics  . Smoking status: Unknown If Ever Smoked  . Smokeless tobacco: Not on file   Comment: Does not smoke  . Alcohol Use: No      Review of Systems  Constitutional: Negative for fever and chills.  HENT: Negative.  Negative for sore throat and rhinorrhea.   Eyes: Negative.  Negative for discharge and redness.  Respiratory: Positive for cough and shortness of breath. Negative for wheezing.   Cardiovascular: Positive for orthopnea. Negative for chest pain and leg swelling.  Gastrointestinal: Negative.  Negative for nausea, vomiting and abdominal pain.  Genitourinary: Negative.  Negative for hematuria.  Musculoskeletal: Negative.  Negative for back pain.  Skin: Negative.  Negative for color change and rash.  Neurological: Positive for weakness. Negative for syncope and headaches.  Hematological: Negative.  Negative for adenopathy.  Psychiatric/Behavioral: Negative.  Negative for confusion.  All other systems reviewed and are negative.    Allergies  Motrin  Home Medications   Current Outpatient Rx  Name Route Sig Dispense Refill  . DIGOXIN 0.125 MG PO TABS Oral Take 125 mcg by mouth daily.      Marland Kitchen ESCITALOPRAM OXALATE 20 MG PO TABS Oral Take 20 mg by mouth daily.      Marland Kitchen FAMOTIDINE 20 MG PO TABS Oral Take 20 mg by mouth 2 (two) times daily as needed. For reflux.    . FUROSEMIDE 40 MG PO TABS Oral Take 40 mg by mouth daily.      Marland Kitchen HYDRALAZINE HCL 25 MG PO TABS Oral Take 25 mg by mouth 3 (three) times daily. 1 1/2 every 8 hours       . INSULIN DETEMIR 100 UNIT/ML [place] SOLN Subcutaneous Inject 20 Units into the skin at bedtime.    Marland Kitchen METOPROLOL TARTRATE 25 MG PO TABS Oral Take 25 mg by mouth daily.      . CENTRUM SILVER PO Oral Take by mouth daily.      Marland Kitchen NIACIN 500 MG PO TABS Oral Take 500 mg by mouth at bedtime. 2 po at bedtime     . FISH OIL 1000 MG PO CAPS Oral Take 1,000 mg by mouth daily. 2 po daily     . POTASSIUM CHLORIDE ER 10 MEQ PO CPCR Oral Take 10 mEq by mouth daily. Take only Monday -friday     . ROSUVASTATIN CALCIUM 5 MG PO TABS Oral Take 5 mg by mouth daily.      . WARFARIN SODIUM 5 MG PO TABS Oral Take 5 mg by mouth daily.       BP 124/63  Pulse 70  Temp 97.9 F (36.6 C)  Resp 18  SpO2 87%  Physical Exam  Nursing note and vitals reviewed. Constitutional: He is oriented to person, place, and time. He appears well-developed and well-nourished.  Non-toxic appearance. He does not have a sickly appearance.  HENT:  Head: Normocephalic and atraumatic.  Eyes: Conjunctivae, EOM and lids are normal. Pupils are equal, round, and reactive to light.  Neck: Trachea normal, normal range of motion and full passive range of motion without pain. Neck supple.  Cardiovascular: Normal rate, regular rhythm and normal heart sounds.   Pulmonary/Chest: Effort normal and breath sounds normal. No respiratory distress. He has no wheezes.  Abdominal: Soft. Normal appearance. He exhibits no distension. There is no tenderness. There is no rebound and no CVA tenderness.  Musculoskeletal: Normal range of motion.  Neurological: He is alert and oriented to person, place, and time. He has normal strength.  Skin: Skin is warm, dry and intact. No rash noted.  Psychiatric: He has a normal mood and affect. His behavior is normal. Judgment and thought content normal.    ED Course  Procedures (including critical care time)  Results for orders placed during the hospital encounter of 11/15/11  CBC      Component Value Range   WBC 7.1   4.0 - 10.5 (K/uL)   RBC 4.84  4.22 - 5.81 (MIL/uL)   Hemoglobin 15.3  13.0 - 17.0 (g/dL)   HCT 45.4  09.8 - 11.9 (%)   MCV 92.8  78.0 - 100.0 (fL)   MCH 31.6  26.0 - 34.0 (pg)   MCHC 34.1  30.0 - 36.0 (g/dL)   RDW 14.7  82.9 - 56.2 (%)   Platelets 104 (*) 150 - 400 (  K/uL)  DIFFERENTIAL      Component Value Range   Neutrophils Relative 64  43 - 77 (%)   Neutro Abs 4.5  1.7 - 7.7 (K/uL)   Lymphocytes Relative 28  12 - 46 (%)   Lymphs Abs 2.0  0.7 - 4.0 (K/uL)   Monocytes Relative 8  3 - 12 (%)   Monocytes Absolute 0.6  0.1 - 1.0 (K/uL)   Eosinophils Relative 0  0 - 5 (%)   Eosinophils Absolute 0.0  0.0 - 0.7 (K/uL)   Basophils Relative 1  0 - 1 (%)   Basophils Absolute 0.1  0.0 - 0.1 (K/uL)  BASIC METABOLIC PANEL      Component Value Range   Sodium 135  135 - 145 (mEq/L)   Potassium 3.4 (*) 3.5 - 5.1 (mEq/L)   Chloride 93 (*) 96 - 112 (mEq/L)   CO2 31  19 - 32 (mEq/L)   Glucose, Bld 100 (*) 70 - 99 (mg/dL)   BUN 28 (*) 6 - 23 (mg/dL)   Creatinine, Ser 1.61  0.50 - 1.35 (mg/dL)   Calcium 9.2  8.4 - 09.6 (mg/dL)   GFR calc non Af Amer 73 (*) >90 (mL/min)   GFR calc Af Amer 85 (*) >90 (mL/min)  CARDIAC PANEL(CRET KIN+CKTOT+MB+TROPI)      Component Value Range   Total CK 48  7 - 232 (U/L)   CK, MB 1.7  0.3 - 4.0 (ng/mL)   Troponin I <0.30  <0.30 (ng/mL)   Relative Index RELATIVE INDEX IS INVALID  0.0 - 2.5   PRO B NATRIURETIC PEPTIDE      Component Value Range   Pro B Natriuretic peptide (BNP) 5666.0 (*) 0 - 450 (pg/mL)  PROTIME-INR      Component Value Range   Prothrombin Time 21.6 (*) 11.6 - 15.2 (seconds)   INR 1.84 (*) 0.00 - 1.49    Dg Chest 2 View  11/15/2011  *RADIOLOGY REPORT*  Clinical Data: Chest pain. Shortness of breath.  CHEST - 2 VIEW  Comparison: 12/22/2010.  Findings: Biventricular pacer is in place with leads unchanged in position.  Cardiomegaly.  Central pulmonary vascular prominence. Small right-sided pleural effusion.  Calcified aorta.  Prominent nipple  shadows.  Mild wedge compression deformity mid thoracic vertebra without change.  IMPRESSION: Cardiomegaly with pulmonary vascular congestion and small right- sided pleural effusion.  Original Report Authenticated By: Fuller Canada, M.D.      Date: 11/15/2011  Rate: 73  Rhythm: Paced rhythm  QRS Axis: left  Intervals: normal  ST/T Wave abnormalities: nonspecific ST changes  Conduction Disutrbances:Widened QRS due to pacer  Narrative Interpretation:   Old EKG Reviewed: unchanged from 10/30/2010    MDM  Patient appears to be consistent with a CHF exacerbation at this time.  His BNP is elevated and his chest x-ray shows pulmonary edema.  Cardiology has already written for Lasix for this patient.  I anticipate this patient being admitted to the cardiology service for further management of his CHF exacerbation.        [name], MD 11/15/11 [phone]

## 2011-11-15 NOTE — ED Notes (Signed)
Cardiology at bedside.

## 2011-11-15 NOTE — ED Notes (Signed)
Report called to floor, receiving RN requests 20 minutes to get room ready.

## 2011-11-15 NOTE — ED Notes (Signed)
Wife stated, he's been SOB for 2 weeks on-going.  He doesn't want to eat.

## 2011-11-15 NOTE — H&P (Signed)
Pt. Seen and examined. Agree with the NP/PA-C note as written. Nice gentleman with a history of ischemic cardiomyopathy s/p Bi-V AICD placement, history of CABG with multiple occluded grafts and PE on coumadin. He has been failing to thrive for several months. He was taken to [facility] by his wife and they told them there was nothing they could do. Prior EF was 40-45% in 3/12, up from 25-35% prior to the BI-V.  Initially he improved, however, over the past few days he has had increasing cough, anorexia, orthopnea, PND and chest pressure.  Labs indicate BNP ~6000. Exam + for bilateral coarse rhonchi and dullness at the bases, PMI is displaced laterally and precordial sounds are soft. JVD is ~10 cm H20. Will admit for diuresis. Re-check 2D echo. Will add azithromycin, nebulizers and mucinex for his pulmonary exacerbation.  Chrystie Nose, MD, [facility] Attending Cardiologist [facility]

## 2011-11-16 DIAGNOSIS — Z7901 Long term (current) use of anticoagulants: Secondary | ICD-10-CM

## 2011-11-16 DIAGNOSIS — I2699 Other pulmonary embolism without acute cor pulmonale: Secondary | ICD-10-CM | POA: Diagnosis present

## 2011-11-16 DIAGNOSIS — I2510 Atherosclerotic heart disease of native coronary artery without angina pectoris: Secondary | ICD-10-CM | POA: Diagnosis present

## 2011-11-16 DIAGNOSIS — I255 Ischemic cardiomyopathy: Secondary | ICD-10-CM | POA: Diagnosis present

## 2011-11-16 DIAGNOSIS — Z95810 Presence of automatic (implantable) cardiac defibrillator: Secondary | ICD-10-CM | POA: Diagnosis present

## 2011-11-16 DIAGNOSIS — R001 Bradycardia, unspecified: Secondary | ICD-10-CM | POA: Diagnosis not present

## 2011-11-16 LAB — GLUCOSE, CAPILLARY
Glucose-Capillary: 84 mg/dL (ref 70–99)
Glucose-Capillary: 144 mg/dL — ABNORMAL HIGH (ref 70–99)
Glucose-Capillary: 87 mg/dL (ref 70–99)
Glucose-Capillary: 169 mg/dL — ABNORMAL HIGH (ref 70–99)

## 2011-11-16 LAB — BASIC METABOLIC PANEL
CO2: 35 mEq/L — ABNORMAL HIGH (ref 19–32)
Calcium: 9.2 mg/dL (ref 8.4–10.5)
GFR calc non Af Amer: 58 mL/min — ABNORMAL LOW (ref >90)
Potassium: 4 mEq/L (ref 3.5–5.1)
Sodium: 139 mEq/L (ref 135–145)

## 2011-11-16 LAB — CBC
Hemoglobin: 14.3 g/dL (ref 13.0–17.0)
Platelets: 124 10*3/uL — ABNORMAL LOW (ref 150–400)
RBC: 4.48 MIL/uL (ref 4.22–5.81)
WBC: 7.4 10*3/uL (ref 4.0–10.5)

## 2011-11-16 LAB — CARDIAC PANEL(CRET KIN+CKTOT+MB+TROPI)
CK, MB: 1.7 ng/mL (ref 0.3–4.0)
Relative Index: INVALID (ref 0.0–2.5)
Total CK: 39 U/L (ref 7–232)
Troponin I: <0.3 ng/mL (ref <0.30)

## 2011-11-16 LAB — PROTIME-INR: Prothrombin Time: 23.3 seconds — ABNORMAL HIGH (ref 11.6–15.2)

## 2011-11-16 MED ORDER — ZOLPIDEM TARTRATE 5 MG PO TABS: 5.0000 mg | ORAL_TABLET | Freq: Every evening | ORAL | Status: DC | PRN

## 2011-11-16 MED ORDER — ALPRAZOLAM 0.25 MG PO TABS
0.2500 mg | ORAL_TABLET | Freq: Three times a day (TID) | ORAL | Status: DC | PRN
  Administered 2011-11-25: 0.25 mg via ORAL
  Filled 2011-11-16: qty 1

## 2011-11-16 MED ORDER — WARFARIN SODIUM 5 MG PO TABS
5.0000 mg | ORAL_TABLET | Freq: Once | ORAL | Status: AC
  Administered 2011-11-16: 5 mg via ORAL
  Filled 2011-11-16: qty 1

## 2011-11-16 NOTE — Progress Notes (Signed)
ANTICOAGULATION CONSULT NOTE - Follow Up Consult  Pharmacy Consult for coumadin Indication: history of PE  Allergies  Allergen Reactions  . Motrin (Ibuprofen) Other (See Comments)    Put hole in stomach    Patient Measurements: Height: 5\' 5"  (165.1 cm) Weight: 180 lb 11.2 oz (81.965 kg) (c scale) IBW/kg (Calculated) : 61.5    Vital Signs: Temp: 97.9 F (36.6 C) (04/02 0625) Temp src: Oral (04/02 0625) BP: 125/67 mmHg (04/02 0625) Pulse Rate: 70  (04/02 0625)  Labs:  Basename 11/16/11 0500 11/15/11 2145 11/15/11 1316 11/15/11 1315  HGB 14.3 -- -- 15.3  HCT 41.9 -- -- 44.9  PLT 124* -- -- 104*  APTT -- -- -- --  LABPROT 23.3* -- -- 21.6*  INR 2.03* -- -- 1.84*  HEPARINUNFRC -- -- -- --  CREATININE 1.13 -- -- 0.98  CKTOTAL 39 44 48 --  CKMB 1.7 1.8 1.7 --  TROPONINI <0.30 <0.30 <0.30 --   Estimated Creatinine Clearance: 48 ml/min (by C-G formula based on Cr of 1.13).   Medications:  Scheduled:    . aspirin EC  81 mg Oral Daily  . atorvastatin  10 mg Oral q1800  . azithromycin  500 mg Oral Daily   Followed by  . azithromycin  250 mg Oral Daily  . digoxin  125 mcg Oral Daily  . escitalopram  20 mg Oral Daily  . fenofibrate  160 mg Oral Daily  . furosemide  40 mg Intravenous Once  . furosemide  40 mg Intravenous BID  . guaiFENesin  600 mg Oral BID  . hydrALAZINE  25 mg Oral TID  . insulin aspart  0-15 Units Subcutaneous TID WC  . insulin detemir  20 Units Subcutaneous QHS  . metoprolol tartrate  25 mg Oral Daily  . niacin  500 mg Oral QHS  . omega-3 acid ethyl esters  1 g Oral BID  . potassium chloride  20 mEq Oral Daily  . potassium chloride  20 mEq Oral Once  . sodium chloride  3 mL Intravenous Q12H  . warfarin  7.5 mg Oral ONCE-1800  . Warfarin - Pharmacist Dosing Inpatient   Does not apply q1800    Assessment: 78 yo male here with COPD and HF on coumadin for history of PE and INR is at goal (2.03). Coumadin dose PTA was 5mg /day. Coumadin sensitivity  may increase in face of HF.  Goal of Therapy:  INR 2-3   Plan:  -Will give 5mg  coumadin today  Benny Lennert 11/16/2011,10:03 AM

## 2011-11-16 NOTE — Progress Notes (Signed)
Subjective:  SOB improving, still SOB  Objective:  Vital Signs in the last 24 hours: Temp:  [97.8 F (36.6 C)-98.5 F (36.9 C)] 97.9 F (36.6 C) (04/02 0625) Pulse Rate:  [70-72] 70  (04/02 0625) Resp:  [18-20] 20  (04/02 0625) BP: (111-135)/(58-72) 125/67 mmHg (04/02 0625) SpO2:  [87 %-98 %] 95 % (04/02 0625) FiO2 (%):  [3 %] 3 % (04/01 2107) Weight:  [81.965 kg (180 lb 11.2 oz)-83.6 kg (184 lb 4.9 oz)] 81.965 kg (180 lb 11.2 oz) (04/02 0625)  Intake/Output from previous day:  Intake/Output Summary (Last 24 hours) at 11/16/11 1000 Last data filed at 11/16/11 0859  Gross per 24 hour  Intake    660 ml  Output   1865 ml  Net  -1205 ml      . aspirin EC  81 mg Oral Daily  . atorvastatin  10 mg Oral q1800  . azithromycin  500 mg Oral Daily   Followed by  . azithromycin  250 mg Oral Daily  . digoxin  125 mcg Oral Daily  . escitalopram  20 mg Oral Daily  . fenofibrate  160 mg Oral Daily  . furosemide  40 mg Intravenous Once  . furosemide  40 mg Intravenous BID  . guaiFENesin  600 mg Oral BID  . hydrALAZINE  25 mg Oral TID  . insulin aspart  0-15 Units Subcutaneous TID WC  . insulin detemir  20 Units Subcutaneous QHS  . metoprolol tartrate  25 mg Oral Daily  . niacin  500 mg Oral QHS  . omega-3 acid ethyl esters  1 g Oral BID  . potassium chloride  20 mEq Oral Daily  . potassium chloride  20 mEq Oral Once  . sodium chloride  3 mL Intravenous Q12H  . warfarin  5 mg Oral ONCE-1800  . warfarin  7.5 mg Oral ONCE-1800  . Warfarin - Pharmacist Dosing Inpatient   Does not apply q1800    Physical Exam: General appearance: alert, cooperative and no distress Neck: no JVD and supple, symmetrical, trachea midline Lungs: rhonchi Rt base;  Mild wheezing Heart: regular rate and rhythm and soft systolic murmur BS+ Trivial edema   Rate: 70  Rhythm: paced  Lab Results:  Basename 11/16/11 0500 11/15/11 1315  WBC 7.4 7.1  HGB 14.3 15.3  PLT 124* 104*    Basename 11/16/11  0500 11/15/11 1315  NA 139 135  K 4.0 3.4*  CL 97 93*  CO2 35* 31  GLUCOSE 88 100*  BUN 31* 28*  CREATININE 1.13 0.98    Basename 11/16/11 0500 11/15/11 2145  TROPONINI <0.30 <0.30   Hepatic Function Panel No results found for this basename: PROT,ALBUMIN,AST,ALT,ALKPHOS,BILITOT,BILIDIR,IBILI in the last 72 hours No results found for this basename: CHOL in the last 72 hours  Basename 11/16/11 0500  INR 2.03*   BNP (last 3 results)  Basename 11/15/11 1316  PROBNP 5666.0*   Imaging: Dg Chest 2 View  11/15/2011  *RADIOLOGY REPORT*  Clinical Data: Chest pain. Shortness of breath.  CHEST - 2 VIEW  Comparison: 12/22/2010.  Findings: Biventricular pacer is in place with leads unchanged in position.  Cardiomegaly.  Central pulmonary vascular prominence. Small right-sided pleural effusion.  Calcified aorta.  Prominent nipple shadows.  Mild wedge compression deformity mid thoracic vertebra without change.  IMPRESSION: Cardiomegaly with pulmonary vascular congestion and small right- sided pleural effusion.  Original Report Authenticated By: Fuller Canada, M.D.    Cardiac Studies:  Assessment/Plan:   Principal Problem:  *  Acute on chronic systolic congestive heart failure, BNP 6k  Active Problems:  ICM last EF 40-45% 2D 8/12 after bi-V upgrade, (imrpoved from 20-25%)  Biventricular ICD (implantable cardiac defibrillator) in place, 3/12  Bronchitis, on ABs  DM  DYSLIPIDEMIA  HYPERTENSION  SLEEP APNEA  PE, DVT history  Chronic anticoagulation for PE hx  Bradycardia, original pacer placed 2008  CAD, cabg 1994, multiple PCIs, last cath July 2011, med Rx  Plan- continue IV lasix, check 2D, f/u BMP, BNP in am.   Corine Shelter PA-C 11/16/2011, 10:00 AM   Patient seen and examined. Agree with assessment and plan. Pt feels improved but still with expiratory wheeze.  Continue with diuresis. Tolerating azithromycin. Also F/U CXR tomorrow.  Echo today.   Lennette Bihari, MD,  [facility] 11/16/2011 10:36 AM

## 2011-11-16 NOTE — Progress Notes (Signed)
UR Completed. Simmons, Kinzie Wickes F [phone]  

## 2011-11-17 ENCOUNTER — Inpatient Hospital Stay (HOSPITAL_COMMUNITY): Payer: Medicare Other

## 2011-11-17 ENCOUNTER — Encounter (HOSPITAL_COMMUNITY): Payer: Self-pay | Admitting: Cardiology

## 2011-11-17 DIAGNOSIS — D696 Thrombocytopenia, unspecified: Secondary | ICD-10-CM

## 2011-11-17 HISTORY — DX: Thrombocytopenia, unspecified: D69.6

## 2011-11-17 LAB — GLUCOSE, CAPILLARY: Glucose-Capillary: 103 mg/dL — ABNORMAL HIGH (ref 70–99)

## 2011-11-17 LAB — BASIC METABOLIC PANEL
Calcium: 9.1 mg/dL (ref 8.4–10.5)
GFR calc Af Amer: 85 mL/min — ABNORMAL LOW (ref >90)
GFR calc non Af Amer: 73 mL/min — ABNORMAL LOW (ref >90)
Glucose, Bld: 89 mg/dL (ref 70–99)
Potassium: 3.7 mEq/L (ref 3.5–5.1)
Sodium: 138 mEq/L (ref 135–145)

## 2011-11-17 LAB — PROTIME-INR
INR: 2.36 — ABNORMAL HIGH (ref 0.00–1.49)
Prothrombin Time: 26.2 seconds — ABNORMAL HIGH (ref 11.6–15.2)

## 2011-11-17 LAB — PRO B NATRIURETIC PEPTIDE: Pro B Natriuretic peptide (BNP): 4249 pg/mL — ABNORMAL HIGH (ref 0–450)

## 2011-11-17 MED ORDER — ISOSORBIDE DINITRATE 10 MG PO TABS
10.0000 mg | ORAL_TABLET | Freq: Three times a day (TID) | ORAL | Status: DC
  Administered 2011-11-17 – 2011-11-23 (×18): 10 mg via ORAL
  Filled 2011-11-17 (×20): qty 1

## 2011-11-17 MED ORDER — WARFARIN SODIUM 5 MG PO TABS
5.0000 mg | ORAL_TABLET | Freq: Once | ORAL | Status: AC
  Administered 2011-11-17: 5 mg via ORAL
  Filled 2011-11-17: qty 1

## 2011-11-17 MED ORDER — FUROSEMIDE 10 MG/ML IJ SOLN
80.0000 mg | Freq: Two times a day (BID) | INTRAMUSCULAR | Status: DC
  Administered 2011-11-17 – 2011-11-20 (×6): 80 mg via INTRAVENOUS
  Filled 2011-11-17 (×8): qty 8

## 2011-11-17 MED ORDER — DOCUSATE SODIUM 100 MG PO CAPS
100.0000 mg | ORAL_CAPSULE | Freq: Two times a day (BID) | ORAL | Status: DC
  Administered 2011-11-17 – 2011-11-25 (×16): 100 mg via ORAL
  Filled 2011-11-17 (×17): qty 1

## 2011-11-17 MED ORDER — LACTULOSE 10 GM/15ML PO SOLN
20.0000 g | Freq: Every day | ORAL | Status: DC | PRN
  Administered 2011-11-17 – 2011-11-20 (×2): 20 g via ORAL
  Filled 2011-11-17 (×3): qty 30

## 2011-11-17 NOTE — Progress Notes (Signed)
ANTICOAGULATION CONSULT NOTE - Follow Up Consult  Pharmacy Consult for coumadin Indication: history of PE  Allergies  Allergen Reactions  . Motrin (Ibuprofen) Other (See Comments)    Put hole in stomach    Patient Measurements: Height: 5\' 5"  (165.1 cm) Weight: 179 lb 14.3 oz (81.6 kg) (c scale) IBW/kg (Calculated) : 61.5    Vital Signs: Temp: 97.6 F (36.4 C) (04/03 0608) BP: 112/58 mmHg (04/03 0927) Pulse Rate: 72  (04/03 0926)  Labs:  Basename 11/17/11 0559 11/16/11 1349 11/16/11 0500 11/15/11 2145 11/15/11 1315  HGB -- -- 14.3 -- 15.3  HCT -- -- 41.9 -- 44.9  PLT -- -- 124* -- 104*  APTT -- -- -- -- --  LABPROT 26.2* -- 23.3* -- 21.6*  INR 2.36* -- 2.03* -- 1.84*  HEPARINUNFRC -- -- -- -- --  CREATININE 0.99 -- 1.13 -- 0.98  CKTOTAL -- 58 39 44 --  CKMB -- 1.8 1.7 1.8 --  TROPONINI -- <0.30 <0.30 <0.30 --   Estimated Creatinine Clearance: 54.6 ml/min (by C-G formula based on Cr of 0.99).   Medications:  Scheduled:     . aspirin EC  81 mg Oral Daily  . atorvastatin  10 mg Oral q1800  . azithromycin  250 mg Oral Daily  . digoxin  125 mcg Oral Daily  . escitalopram  20 mg Oral Daily  . fenofibrate  160 mg Oral Daily  . furosemide  40 mg Intravenous BID  . guaiFENesin  600 mg Oral BID  . hydrALAZINE  25 mg Oral TID  . insulin aspart  0-15 Units Subcutaneous TID WC  . insulin detemir  20 Units Subcutaneous QHS  . metoprolol tartrate  25 mg Oral Daily  . niacin  500 mg Oral QHS  . omega-3 acid ethyl esters  1 g Oral BID  . potassium chloride  20 mEq Oral Daily  . sodium chloride  3 mL Intravenous Q12H  . warfarin  5 mg Oral ONCE-1800  . Warfarin - Pharmacist Dosing Inpatient   Does not apply q1800    Assessment: 78 yo male here with COPD and HF on coumadin for history of PE and INR is at goal (2.36). Coumadin dose PTA was 5mg /day. Coumadin sensitivity may increase in face of HF.  Goal of Therapy:  INR 2-3   Plan:  -Will give 5mg  coumadin today and  follow daily INR for now   Benny Lennert 11/17/2011,10:21 AM

## 2011-11-17 NOTE — Clinical Documentation Improvement (Signed)
RESPIRATORY FAILURE DOCUMENTATION CLARIFICATION QUERY   THIS DOCUMENT IS NOT A PERMANENT PART OF THE MEDICAL RECORD   Please update your documentation within the medical record to reflect your response to this query.                                                                                    11/17/11  [name], NP and/or Associates,  In a better effort to capture your patient's severity of illness, reflect appropriate length of stay and utilization of resources, a review of the patient medical record has revealed the following indicators.    Based on your clinical judgment, please document in the progress notes and discharge summary if a condition below provides greater specificity regarding the patient's respiratory status:   - Chronic Respiratory Failure, 02 dependent   - Chronic Respiratory Insufficiency, 02 dependent   - Other Condition   - Unable to Clinically Determine  Clinical Information:  CHRONIC OBSTRUCTIVE PULMONARY DISEASE, OXYGEN DEPENDENT  HAGER,BRYAN W  11/15/2011, 1:45 PM  Pt has had his home O2 requirement increased recently from 3 to 4 L [place]. [name], MD  11/15/11 1417   In responding to this query please exercise your independent judgment.  The fact that a query is asked, does not imply that any particular answer is desired or expected.   Reviewed: additional documentation in the medical record  Thank You,  [name]  RN BSN Certified Clinical Documentation Specialist: Cell   [phone]  Health Information Management   TO RESPOND TO THE THIS QUERY, FOLLOW THE INSTRUCTIONS BELOW:  1. If needed, update documentation for the patient's encounter via the notes activity.  2. Access this query again and click edit on the In [employer].  3. After updating, or not, click F2 to complete all highlighted (required) fields concerning your review. Select "additional documentation in the medical record" OR "no additional  documentation provided".  4. Click Sign note button.  5. The deficiency will fall out of your In Basket Please let us know if you are not able to complete this workflow by phone or e-mail (listed below). Thank you,   I've been trying to understand why he is on home O2, He never smoked.  But yes chronic resp. Failure is most likely diagnosis.Samuel Christensen

## 2011-11-17 NOTE — Progress Notes (Signed)
  Echocardiogram 2D Echocardiogram has been performed.  Samuel Christensen, [name] 11/17/2011, 11:27 AM

## 2011-11-17 NOTE — Clinical Documentation Improvement (Signed)
Clinical Documentation Improvement Program       Query Request  THIS DOCUMENT IS NOT A PERMANENT PART OF THE MEDICAL RECORD          11/17/11  [name], NP and/or Associates,  In an effort to better capture your patient's severity of illness, reflect appropriate length of stay and utilization of resources, a review of the patient medical record has revealed the following indicators.   Please exercise your independent judgment. The fact queries are asked, does not imply that any particular answer is desired or expected.   Please document in the progress notes and discharge summary if a condition below provides greater specificity regarding the patient's documented "pulmonary exacerbation" treated with Azithromycin, Nebulizers, and Mucinex    - Copd Exacerbation   - Other Condition    - Unable to Clinically Determine   Clinical Information:  "Will add azithromycin, nebulizers and mucinex for his pulmonary exacerbation."   Chrystie Nose, MD, [facility]  11/15/11   Conditions documented as possible, probable, or suspected can be coded if restated at the time of discharge.    Thank You,  [name]  RN BSN Certified Clinical Documentation Specialist: Cell   [phone]  Health Information Management [place]  TO RESPOND TO THE THIS QUERY, FOLLOW THE INSTRUCTIONS BELOW:  1. If needed, update documentation for the patient's encounter via the notes activity.  2. Access this query again and click edit on the In [employer].  3. After updating, or not, click F2 to complete all highlighted (required) fields concerning your review. Select "additional documentation in the medical record" OR "no additional documentation provided".  4. Click Sign note button.  5. The deficiency will fall out of your In Basket *Please let us know if you are not able to complete this workflow by phone or e-mail (listed below). Thanks I will add acute bronchitis

## 2011-11-17 NOTE — Progress Notes (Signed)
Nice gentleman with a history of ischemic cardiomyopathy s/p Bi-V AICD placement, history of CABG with multiple occluded grafts and PE on coumadin. He has been failing to thrive for several months. He was taken to [facility] by his wife and they told them there was nothing they could do. Prior EF was 40-45% in 3/12, up from 25-35% prior to the BI-V. Initially he improved, however, over the past few days he has had increasing cough, anorexia, orthopnea, PND and chest pressure. Labs indicate BNP ~6000. Exam + for bilateral coarse rhonchi and dullness at the bases, PMI is displaced laterally and precordial sounds are soft. JVD is ~10 cm H20. Admitted for diuresis.  Subjective: SOB slowly improving, complains of constipation.  Objective: Vital signs in last 24 hours: Temp:  [97.3 F (36.3 C)-97.8 F (36.6 C)] 97.6 F (36.4 C) (04/03 4332) Pulse Rate:  [70-75] 72  (04/03 0926) Resp:  [16-20] 16  (04/03 0608) BP: (106-131)/(52-68) 112/58 mmHg (04/03 0927) SpO2:  [95 %-97 %] 95 % (04/03 0608) Weight:  [81.6 kg (179 lb 14.3 oz)] 81.6 kg (179 lb 14.3 oz) (04/03 9518) Weight change: -2 kg (-4 lb 6.5 oz) Last BM Date: 11/13/11 Intake/Output from previous day: -70  WEIGHT: 81.6 down from 81.9 yest.  But was 83.6 on admit 04/02 0701 - 04/03 0700 In: 680 [P.O.:680] Out: 750 [Urine:750] Intake/Output this shift: Total I/O In: 363 [P.O.:360; I.V.:3] Out: 600 [Urine:600]  PE: General:Alert and oriented.  Pleasant affect. Neck:No JVD at 40 degrees Heart:S1S2 RRR,, no murmur, gallup, rub or click.  Lungs:harsh breath sounds, + rhonchi, diminished in the bases.  Abd:+ BS, soft, non tender Ext:no edema.    Lab Results:  Basename 11/16/11 0500 11/15/11 1315  WBC 7.4 7.1  HGB 14.3 15.3  HCT 41.9 44.9  PLT 124* 104*   BMET  Basename 11/17/11 0559 11/16/11 0500  NA 138 139  K 3.7 4.0  CL 97 97  CO2 32 35*  GLUCOSE 89 88  BUN 25* 31*  CREATININE 0.99 1.13  CALCIUM 9.1 9.2     Basename 11/16/11 1349 11/16/11 0500  TROPONINI <0.30 <0.30    Lab Results  Component Value Date   CHOL  Value: 105        ATP III CLASSIFICATION:  <200     mg/dL   Desirable  841-660  mg/dL   Borderline High  >=630    mg/dL   High        1/60/1093   HDL 23* 03/04/2010   LDLCALC  Value: 48        Total Cholesterol/HDL:CHD Risk Coronary Heart Disease Risk Table                     Men   Women  1/2 Average Risk   3.4   3.3  Average Risk       5.0   4.4  2 X Average Risk   9.6   7.1  3 X Average Risk  23.4   11.0        Use the calculated Patient Ratio above and the CHD Risk Table to determine the patient's CHD Risk.        ATP III CLASSIFICATION (LDL):  <100     mg/dL   Optimal  235-573  mg/dL   Near or Above                    Optimal  130-159  mg/dL  Borderline  160-189  mg/dL   High  >161     mg/dL   Very High 0/96/0454   TRIG 170* 03/04/2010   CHOLHDL 4.6 03/04/2010   Lab Results  Component Value Date   HGBA1C  Value: 6.8 (NOTE)                                                                       According to the ADA Clinical Practice Recommendations for 2011, when HbA1c is used as a screening test:   >=6.5%   Diagnostic of Diabetes Mellitus           (if abnormal result  is confirmed)  5.7-6.4%   Increased risk of developing Diabetes Mellitus  References:Diagnosis and Classification of Diabetes Mellitus,Diabetes Care,2011,34(Suppl 1):S62-S69 and Standards of Medical Care in         Diabetes - 2011,Diabetes Care,2011,34  (Suppl 1):S11-S61.* 08/11/2010     Lab Results  Component Value Date   TSH 1.439 08/11/2010     EKG: Orders placed during the hospital encounter of 11/15/11  . ED EKG  . ED EKG    Studies/Results: Dg Chest 2 View  11/17/2011  *RADIOLOGY REPORT*  Clinical Data: CHF, wheezing, shortness of breath.  CHEST - 2 VIEW  Comparison: 11/15/2011  Findings: Cardiomegaly.  Prior CABG.  Left pacer is unchanged. Small right pleural effusion with right lower lobe atelectasis or  scarring, unchanged.  Left lung is clear.  Degenerative changes in the thoracic spine.  Slight compression of a mid thoracic vertebral body, stable.  IMPRESSION: COPD.  Right base scarring or atelectasis.  Small right pleural effusion.  Cardiomegaly.  Original Report Authenticated By: Cyndie Chime, M.D.   Dg Chest 2 View  11/15/2011  *RADIOLOGY REPORT*  Clinical Data: Chest pain. Shortness of breath.  CHEST - 2 VIEW  Comparison: 12/22/2010.  Findings: Biventricular pacer is in place with leads unchanged in position.  Cardiomegaly.  Central pulmonary vascular prominence. Small right-sided pleural effusion.  Calcified aorta.  Prominent nipple shadows.  Mild wedge compression deformity mid thoracic vertebra without change.  IMPRESSION: Cardiomegaly with pulmonary vascular congestion and small right- sided pleural effusion.  Original Report Authenticated By: Fuller Canada, M.D.    Medications: I have reviewed the patient's current medications.    Marland Kitchen aspirin EC  81 mg Oral Daily  . atorvastatin  10 mg Oral q1800  . azithromycin  250 mg Oral Daily  . digoxin  125 mcg Oral Daily  . escitalopram  20 mg Oral Daily  . fenofibrate  160 mg Oral Daily  . furosemide  40 mg Intravenous BID  . guaiFENesin  600 mg Oral BID  . hydrALAZINE  25 mg Oral TID  . insulin aspart  0-15 Units Subcutaneous TID WC  . insulin detemir  20 Units Subcutaneous QHS  . metoprolol tartrate  25 mg Oral Daily  . niacin  500 mg Oral QHS  . omega-3 acid ethyl esters  1 g Oral BID  . potassium chloride  20 mEq Oral Daily  . sodium chloride  3 mL Intravenous Q12H  . warfarin  5 mg Oral ONCE-1800  . warfarin  5 mg Oral ONCE-1800  . Warfarin - Pharmacist Dosing Inpatient   Does  not apply q1800   Assessment/Plan: Patient Active Problem List  Diagnoses  . DM  . DYSLIPIDEMIA  . HYPERTENSION  . GERD  . DEGENERATIVE JOINT DISEASE  . SLEEP APNEA  . Chronic pulmonary embolism  . SYSTOLIC HEART FAILURE, CHRONIC  . CHRONIC  OBSTRUCTIVE PULMONARY DISEASE, OXYGEN DEPENDENT  . PACEMAKER, PERMANENT  . Acute on chronic systolic congestive heart failure, BNP 6k  . Hypokalemia  . ICM last EF 40-45% 2D 8/12 after bi-V upgrade, (imrpoved from 20-25%)  . Biventricular ICD (implantable cardiac defibrillator) in place, 3/12  . Bronchitis, on ABs  . PE, DVT history  . Chronic anticoagulation for PE hx  . Bradycardia, original pacer placed 2008  . CAD, cabg 1994, multiple PCIs, last cath July 2011, med Rx  . Thrombocytopenia   PLAN: Pro BNP down slightly from 5666 to 4249. Cardiac enzymes neg. Plts  Up from 104 to 124. INR 2.36-Therapeutic. Thrombocytopenia- has been episodic since 2011 at least. Lasix is 40 mg. IV BID. ? Increase dose or frequency.  Very little output yesterday.   Awaiting echo results. Glucose stable.  LOS: 2 days   INGOLD,LAURA R 11/17/2011, 12:18 PM   I have seen and examined the patient along with [facility] R, NP.  I have reviewed the chart, notes and new data.  I agree with NP's note.  Key new complaints: Dyspnea and orthopnea improved, despite fairly meager UO Key examination changes: no edema; cardiomegaly by exam, no murmur Key new findings / data: mild thrombocytopenia; EF has decreased substantially since last echo, likely due to interval complete occlusion of SVG to PDA and PLA, but also new worsening of wall motion in LAD territory.  ECHO: Left ventricle: Wall thickness was increased in a pattern of mild LVH. Systolic function was severely reduced. The estimated ejection fraction was in the range of 20% to 25%. Akinesis of the entireinferolateral and inferior myocardium; consistent with infarction. Moderate hypokinesis of the anterior, anterolateral, and apical myocardium. Features are consistent with a pseudonormal left ventricular filling pattern, with concomitant abnormal relaxation and increased filling pressure (grade 2 diastolic dysfunction). Doppler parameters  are consistent with elevated mean left atrial filling pressure. There was a medium-sized, 25mm (L) x 15mm (W), protruding, solid, fixedthrombus.   PLAN: Will need repeat coronary angiography to evaluate drastic drop in LVEF, but have to stop warfarin and bridge w IV heparin due to LV apical thrombus. Will check records to see if there is a contraindication to ACEi/ARB. Add long acting nitrates to hydralazine.  Thurmon Fair, MD, [facility] [phone] 11/17/2011, 1:56 PM

## 2011-11-17 NOTE — Plan of Care (Signed)
Problem: Consults Goal: Heart Failure Patient Education (See Patient Education module for education specifics.)  Outcome: Adequate for Discharge Pt and family given HF booklet went over diet , how to reduce salt intake - used teachback method-  Wife cooks  [employer] production planner  Problem: Phase I Progression Outcomes Goal: EF % per last Echo/documented,Core Reminder form on chart Outcome: Completed/Met Date Met:  11/17/11 EF 11/17/11 20 to 25 % per echo [employer] RN

## 2011-11-18 ENCOUNTER — Encounter (HOSPITAL_COMMUNITY): Payer: Self-pay | Admitting: Cardiology

## 2011-11-18 DIAGNOSIS — Z9862 Peripheral vascular angioplasty status: Secondary | ICD-10-CM

## 2011-11-18 HISTORY — DX: Peripheral vascular angioplasty status: Z98.62

## 2011-11-18 LAB — BASIC METABOLIC PANEL
Chloride: 94 mEq/L — ABNORMAL LOW (ref 96–112)
Creatinine, Ser: 1.1 mg/dL (ref 0.50–1.35)
GFR calc Af Amer: 69 mL/min — ABNORMAL LOW (ref >90)
Sodium: 137 mEq/L (ref 135–145)

## 2011-11-18 LAB — GLUCOSE, CAPILLARY
Glucose-Capillary: 94 mg/dL (ref 70–99)
Glucose-Capillary: 94 mg/dL (ref 70–99)
Glucose-Capillary: 115 mg/dL — ABNORMAL HIGH (ref 70–99)

## 2011-11-18 LAB — PROTIME-INR
INR: 2.73 — ABNORMAL HIGH (ref 0.00–1.49)
Prothrombin Time: 29.4 seconds — ABNORMAL HIGH (ref 11.6–15.2)

## 2011-11-18 MED ORDER — POTASSIUM CHLORIDE CRYS ER 20 MEQ PO TBCR
40.0000 meq | EXTENDED_RELEASE_TABLET | Freq: Every day | ORAL | Status: DC
  Administered 2011-11-19: 40 meq via ORAL
  Filled 2011-11-18: qty 2

## 2011-11-18 NOTE — Progress Notes (Signed)
Clinical Social Work Department ADVANCED HEART FAILURE BRIEF PSYCHOSOCIAL ASSESSMENT 11/18/2011  Patient:  Samuel Christensen, Samuel Christensen   Account Number:  [id number]  Admit Date:  11/15/2011  Clinical Social Worker:  Juliette Mangle   Date/Time:  11/18/2011 01:30 PM  Referred by:  CHF Pilot  Referral date:  11/18/2011 Referred for  Other - See comment   Other referral type:    CHF Managment  HH needs   Interview type:  Family Interview Other interview type:    PSYCHOSOCIAL DATA Living Status:  WIFE Admitted from facility?  N Level of care:    Primary Support Name Primary Support Relationship  Samuel Christensen SPOUSE  Samuel Christensen DAUGHTER   Degree of support available:   Very good    Current Concerns  Post-Acute Placement    Social Work assessment/plan:   CSW met with patient  and patient's family to provide emotional support as well as discuss the d/c planning process . CSW also assessed for degree of support and the need for home health/SNF. Patient is an 52 YOM who was admitted on 11/15/11 with a significant cardiac hx.  Patient is currently awaiting a cardiac cath scheduled possibly on Monday. Patient's needs are unknown at this time due to his complex medical hx. CSW spoke with the patient and family about HH options and also discussed the possible need for skilled nursing depending on the patient's strength when he is medically stable for d/c. CSW will continue to follow and assist with all SW needs as patient's hospital course progresses.   Depression:   unknown   PHQ-9:     Home health:  Y Home health choice:  [facility]  Referrals/Interventions  Psychosocial Support/Ongoing Assessment of Needs   Other Referrals/interventions:   Patient's family reported using [facility] in the past. If patient needs home health they would like to use [facility] as their provider. - CSW requested PT consult from MD   Patient's/Family's response to plan of care:   Patient and  patient's family were appreciative and receptive to information and support provided by CSW. CSW will continue to follow to assist with all SW and d/c needs.    Sabino Niemann, MSW, [employer] [phone]

## 2011-11-18 NOTE — Progress Notes (Signed)
ANTICOAGULATION CONSULT NOTE - Follow Up Consult  Pharmacy Consult for coumadin, heparin Indication: history of PE  Allergies  Allergen Reactions  . Motrin (Ibuprofen) Other (See Comments)    Put hole in stomach    Patient Measurements: Height: 5\' 5"  (165.1 cm) Weight: 176 lb 12.9 oz (80.2 kg) IBW/kg (Calculated) : 61.5    Vital Signs: Temp: 97.8 F (36.6 C) (04/04 0653) BP: 118/68 mmHg (04/04 1032) Pulse Rate: 73  (04/04 1032)  Labs:  Basename 11/18/11 0549 11/17/11 0559 11/16/11 1349 11/16/11 0500 11/15/11 2145 11/15/11 1315  HGB -- -- -- 14.3 -- 15.3  HCT -- -- -- 41.9 -- 44.9  PLT -- -- -- 124* -- 104*  APTT -- -- -- -- -- --  LABPROT 29.4* 26.2* -- 23.3* -- --  INR 2.73* 2.36* -- 2.03* -- --  HEPARINUNFRC -- -- -- -- -- --  CREATININE 1.10 0.99 -- 1.13 -- --  CKTOTAL -- -- 58 39 44 --  CKMB -- -- 1.8 1.7 1.8 --  TROPONINI -- -- <0.30 <0.30 <0.30 --   Estimated Creatinine Clearance: 48.8 ml/min (by C-G formula based on Cr of 1.1).   Medications:  Scheduled:     . aspirin EC  81 mg Oral Daily  . atorvastatin  10 mg Oral q1800  . azithromycin  250 mg Oral Daily  . digoxin  125 mcg Oral Daily  . docusate sodium  100 mg Oral BID  . escitalopram  20 mg Oral Daily  . fenofibrate  160 mg Oral Daily  . furosemide  80 mg Intravenous BID  . guaiFENesin  600 mg Oral BID  . hydrALAZINE  25 mg Oral TID  . insulin aspart  0-15 Units Subcutaneous TID WC  . insulin detemir  20 Units Subcutaneous QHS  . isosorbide dinitrate  10 mg Oral TID  . metoprolol tartrate  25 mg Oral Daily  . niacin  500 mg Oral QHS  . omega-3 acid ethyl esters  1 g Oral BID  . potassium chloride  20 mEq Oral Daily  . sodium chloride  3 mL Intravenous Q12H  . warfarin  5 mg Oral ONCE-1800  . DISCONTD: furosemide  40 mg Intravenous BID  . DISCONTD: Warfarin - Pharmacist Dosing Inpatient   Does not apply q1800    Assessment: 78 yo male here with COPD and HF on coumadin for history of PE  and INR is at goal (2.73). Coumadin dose PTA was 5mg /day. Patient noted for cath and coumadin will be on hold. Heparin to start once INR < 2.0.  Goal of Therapy:  INR 2-3   Plan:  -hold coumadin -Follow INR for heparin start  Samuel Christensen 11/18/2011,12:35 PM

## 2011-11-18 NOTE — Progress Notes (Signed)
Pt. Seen and examined. Agree with the NP/PA-C note as written.  Worsening LVEF with LV apical thrombus. Agree with holding coumadin and therapeutic IV heparin. Plan likely cath on Monday if his INR is suitable. I agree with starting ACE-I. Renal function appears stable despite diuresis. Replete K+ today.  Chrystie Nose, MD, [facility] Attending Cardiologist [facility]

## 2011-11-18 NOTE — Progress Notes (Signed)
Nice gentleman with a history of ischemic cardiomyopathy s/p Bi-V AICD placement, history of CABG with multiple occluded grafts and PE on coumadin. He has been failing to thrive for several months. He was taken to [facility] by his wife and they told them there was nothing they could do. Prior EF was 40-45% in 3/12, up from 25-35% prior to the BI-V. Initially he improved, however, over the past few days he has had increasing cough, anorexia, orthopnea, PND and chest pressure. Labs indicate BNP ~6000. Exam + for bilateral coarse rhonchi and dullness at the bases, PMI is displaced laterally and precordial sounds are soft. JVD is ~10 cm H20. Admitted for diuresis  Subjective: Improved respirations  Objective: Vital signs in last 24 hours: Temp:  [97.3 F (36.3 C)-98.1 F (36.7 C)] 97.8 F (36.6 C) (04/04 0653) Pulse Rate:  [70-73] 73  (04/04 1032) Resp:  [16-20] 16  (04/04 0653) BP: (118-128)/(59-68) 118/68 mmHg (04/04 1032) SpO2:  [94 %-97 %] 97 % (04/04 0653) Weight:  [80.2 kg (176 lb 12.9 oz)] 80.2 kg (176 lb 12.9 oz) (04/04 0653) Weight change: -1.4 kg (-3 lb 1.4 oz) Last BM Date: 11/14/11 Intake/Output from previous day: -812  Weight 80.2 down from 83.6 on admit. 04/03 0701 - 04/04 0700 In: 1083 [P.O.:1080; I.V.:3] Out: 1895 [Urine:1895] Intake/Output this shift: Total I/O In: 410 [P.O.:410] Out: 1 [Urine:1]  PE::General:Alert and oriented. Pleasant affect.  Neck:No JVD at 40 degrees  Heart:S1S2 RRR,, no murmur, gallup, rub or click.  Lungs: + rhonchi, diminished in the bases. But overall improved. Abd:+ BS, soft, non tender  Ext:no edema    Lab Results:  Basename 11/16/11 0500 11/15/11 1315  WBC 7.4 7.1  HGB 14.3 15.3  HCT 41.9 44.9  PLT 124* 104*   BMET  Basename 11/18/11 0549 11/17/11 0559  NA 137 138  K 3.4* 3.7  CL 94* 97  CO2 34* 32  GLUCOSE 83 89  BUN 25* 25*  CREATININE 1.10 0.99  CALCIUM 9.7 9.1    Basename 11/16/11 1349 11/16/11 0500    TROPONINI <0.30 <0.30    Lab Results  Component Value Date   CHOL  Value: 105        ATP III CLASSIFICATION:  <200     mg/dL   Desirable  161-096  mg/dL   Borderline High  >=045    mg/dL   High        11/22/8117   HDL 23* 03/04/2010   LDLCALC  Value: 48        Total Cholesterol/HDL:CHD Risk Coronary Heart Disease Risk Table                     Men   Women  1/2 Average Risk   3.4   3.3  Average Risk       5.0   4.4  2 X Average Risk   9.6   7.1  3 X Average Risk  23.4   11.0        Use the calculated Patient Ratio above and the CHD Risk Table to determine the patient's CHD Risk.        ATP III CLASSIFICATION (LDL):  <100     mg/dL   Optimal  147-829  mg/dL   Near or Above                    Optimal  130-159  mg/dL   Borderline  562-130  mg/dL  High  >190     mg/dL   Very High 11/22/8117   TRIG 170* 03/04/2010   CHOLHDL 4.6 03/04/2010   Lab Results  Component Value Date   HGBA1C  Value: 6.8 (NOTE)                                                                       According to the ADA Clinical Practice Recommendations for 2011, when HbA1c is used as a screening test:   >=6.5%   Diagnostic of Diabetes Mellitus           (if abnormal result  is confirmed)  5.7-6.4%   Increased risk of developing Diabetes Mellitus  References:Diagnosis and Classification of Diabetes Mellitus,Diabetes Care,2011,34(Suppl 1):S62-S69 and Standards of Medical Care in         Diabetes - 2011,Diabetes Care,2011,34  (Suppl 1):S11-S61.* 08/11/2010     Lab Results  Component Value Date   TSH 1.439 08/11/2010    Hepatic Function Panel No results found for this basename: PROT,ALBUMIN,AST,ALT,ALKPHOS,BILITOT,BILIDIR,IBILI in the last 72 hours No results found for this basename: CHOL in the last 72 hours No results found for this basename: PROTIME in the last 72 hours    EKG: Orders placed during the hospital encounter of 11/15/11  . ED EKG  . ED EKG    Studies/Results: Dg Chest 2 View  11/17/2011  *RADIOLOGY REPORT*   Clinical Data: CHF, wheezing, shortness of breath.  CHEST - 2 VIEW  Comparison: 11/15/2011  Findings: Cardiomegaly.  Prior CABG.  Left pacer is unchanged. Small right pleural effusion with right lower lobe atelectasis or scarring, unchanged.  Left lung is clear.  Degenerative changes in the thoracic spine.  Slight compression of a mid thoracic vertebral body, stable.  IMPRESSION: COPD.  Right base scarring or atelectasis.  Small right pleural effusion.  Cardiomegaly.  Original Report Authenticated By: Cyndie Chime, M.D.    Medications: I have reviewed the patient's current medications.    Marland Kitchen aspirin EC  81 mg Oral Daily  . atorvastatin  10 mg Oral q1800  . azithromycin  250 mg Oral Daily  . digoxin  125 mcg Oral Daily  . docusate sodium  100 mg Oral BID  . escitalopram  20 mg Oral Daily  . fenofibrate  160 mg Oral Daily  . furosemide  80 mg Intravenous BID  . guaiFENesin  600 mg Oral BID  . hydrALAZINE  25 mg Oral TID  . insulin aspart  0-15 Units Subcutaneous TID WC  . insulin detemir  20 Units Subcutaneous QHS  . isosorbide dinitrate  10 mg Oral TID  . metoprolol tartrate  25 mg Oral Daily  . niacin  500 mg Oral QHS  . omega-3 acid ethyl esters  1 g Oral BID  . potassium chloride  20 mEq Oral Daily  . sodium chloride  3 mL Intravenous Q12H  . warfarin  5 mg Oral ONCE-1800  . Warfarin - Pharmacist Dosing Inpatient   Does not apply q1800  . DISCONTD: furosemide  40 mg Intravenous BID    ECHO:: Left ventricle: Wall thickness was increased in a pattern of mild LVH. Systolic function was severely reduced. The estimated ejection fraction was in the range of 20% to 25%.  Akinesis of the entireinferolateral and inferior myocardium; consistent with infarction. Moderate hypokinesis of the anterior, anterolateral, and apical myocardium. Features are consistent with a pseudonormal left ventricular filling pattern, with concomitant abnormal relaxation and increased filling pressure  (grade 2 diastolic dysfunction). Doppler parameters are consistent with elevated mean left atrial filling pressure. There was a medium-sized, 25mm (L) x 15mm (W), protruding, solid, fixedthrombus.    Assessment/Plan: Patient Active Problem List  Diagnoses  . DM  . DYSLIPIDEMIA  . HYPERTENSION  . GERD  . DEGENERATIVE JOINT DISEASE  . SLEEP APNEA  . Chronic pulmonary embolism  . SYSTOLIC HEART FAILURE, CHRONIC  . CHRONIC OBSTRUCTIVE PULMONARY DISEASE, OXYGEN DEPENDENT  . PACEMAKER, PERMANENT  . Acute on chronic systolic congestive heart failure, BNP 6k  . Hypokalemia  . ICM last EF 40-45% 2D 8/12 after bi-V upgrade, (imrpoved from 20-25%), but now back to 20-25% BY ECHO 4/3/`13  . Biventricular ICD (implantable cardiac defibrillator) in place, 3/12  . Bronchitis, on ABs  . PE, DVT history  . Chronic anticoagulation for PE hx  . Bradycardia, original pacer placed 2008  . CAD, cabg 1994, multiple PCIs, last cath July 2011, med Rx  . Thrombocytopenia  . S/P renal artery angioplasty, and stent Bilateral, patent by dopplers 08/2011   PLAN: Pt. Would benefit from ACE 1 but he has history or CKD stage 4, last year, that may have been when ACE was D/c'd, He is followed by DR. Fox at [facility] His kidneys improved with stopping ACE. ? Renal consult before restarting?  Slow diuresing Lasix increased yesterday to 80 bid IV  Pro BNP 4164 down from 5666 on admit.  will d/c coumadin and begin Heaprin for cardiac cath most likely Monday.     Last cath was 2011--obtained records Last cath 2011 with graft dysfunction with graft to RCA distal disease, and Occluded VGs to LCX,  Diag, and nonfunctioning LIMA graft.  Dr. Simone Curia saw 03/06/2010 for possible re-do bypass.     Now with decrease in EF secondary to ? Ischemia or he is not responding to BiV as well.  Ambulate in room.      LOS: 3 days   Samuel Christensen R 11/18/2011, 12:17 PM

## 2011-11-19 LAB — BASIC METABOLIC PANEL
BUN: 25 mg/dL — ABNORMAL HIGH (ref 6–23)
Calcium: 10.2 mg/dL (ref 8.4–10.5)
Chloride: 91 mEq/L — ABNORMAL LOW (ref 96–112)
Creatinine, Ser: 1.19 mg/dL (ref 0.50–1.35)
GFR calc non Af Amer: 54 mL/min — ABNORMAL LOW (ref >90)
Potassium: 5 mEq/L (ref 3.5–5.1)
Sodium: 136 mEq/L (ref 135–145)

## 2011-11-19 LAB — PROTIME-INR: INR: 3.31 — ABNORMAL HIGH (ref 0.00–1.49)

## 2011-11-19 LAB — GLUCOSE, CAPILLARY
Glucose-Capillary: 138 mg/dL — ABNORMAL HIGH (ref 70–99)
Glucose-Capillary: 91 mg/dL (ref 70–99)
Glucose-Capillary: 110 mg/dL — ABNORMAL HIGH (ref 70–99)
Glucose-Capillary: 135 mg/dL — ABNORMAL HIGH (ref 70–99)
Glucose-Capillary: 122 mg/dL — ABNORMAL HIGH (ref 70–99)

## 2011-11-19 MED ORDER — COLCHICINE 0.6 MG PO TABS
0.6000 mg | ORAL_TABLET | Freq: Two times a day (BID) | ORAL | Status: DC
  Administered 2011-11-19 – 2011-11-21 (×5): 0.6 mg via ORAL
  Filled 2011-11-19 (×6): qty 1

## 2011-11-19 MED ORDER — POTASSIUM CHLORIDE CRYS ER 20 MEQ PO TBCR
20.0000 meq | EXTENDED_RELEASE_TABLET | Freq: Every day | ORAL | Status: DC
  Filled 2011-11-19 (×2): qty 1

## 2011-11-19 NOTE — Progress Notes (Signed)
K+ 5.0 [name] NP notified.

## 2011-11-19 NOTE — Progress Notes (Signed)
ANTICOAGULATION CONSULT NOTE - Follow Up Consult  Pharmacy Consult for heparin Indication: history of PE  Allergies  Allergen Reactions  . Motrin (Ibuprofen) Other (See Comments)    Put hole in stomach    Patient Measurements: Height: 5\' 5"  (165.1 cm) Weight: 176 lb 3.2 oz (79.924 kg) (c scale) IBW/kg (Calculated) : 61.5    Vital Signs: Temp: 97.6 F (36.4 C) (04/05 0537) Temp src: Oral (04/05 0537) BP: 126/74 mmHg (04/05 1036) Pulse Rate: 92  (04/05 1036)  Labs:  Basename 11/19/11 0615 11/18/11 0549 11/17/11 0559 11/16/11 1349  HGB -- -- -- --  HCT -- -- -- --  PLT -- -- -- --  APTT -- -- -- --  LABPROT 34.1* 29.4* 26.2* --  INR 3.31* 2.73* 2.36* --  HEPARINUNFRC -- -- -- --  CREATININE -- 1.10 0.99 --  CKTOTAL -- -- -- 58  CKMB -- -- -- 1.8  TROPONINI -- -- -- <0.30   Estimated Creatinine Clearance: 48.7 ml/min (by C-G formula based on Cr of 1.1).   Medications:  Scheduled:     . aspirin EC  81 mg Oral Daily  . atorvastatin  10 mg Oral q1800  . azithromycin  250 mg Oral Daily  . digoxin  125 mcg Oral Daily  . docusate sodium  100 mg Oral BID  . escitalopram  20 mg Oral Daily  . fenofibrate  160 mg Oral Daily  . furosemide  80 mg Intravenous BID  . guaiFENesin  600 mg Oral BID  . hydrALAZINE  25 mg Oral TID  . insulin aspart  0-15 Units Subcutaneous TID WC  . insulin detemir  20 Units Subcutaneous QHS  . isosorbide dinitrate  10 mg Oral TID  . metoprolol tartrate  25 mg Oral Daily  . niacin  500 mg Oral QHS  . omega-3 acid ethyl esters  1 g Oral BID  . potassium chloride  40 mEq Oral Daily  . sodium chloride  3 mL Intravenous Q12H  . DISCONTD: potassium chloride  20 mEq Oral Daily    Assessment: 78 yo male here with COPD and HF on coumadin for history of PE and INR is at 3.31. Coumadin dose PTA was 5mg /day. Patient noted for cath and coumadin will be on hold. Heparin to start once INR < 2.0.  Goal of Therapy:  INR 2-3   Plan:  -hold  coumadin -Follow INR for heparin start  Benny Lennert 11/19/2011,12:28 PM

## 2011-11-19 NOTE — Progress Notes (Signed)
Pt. Seen and examined. Agree with the NP/PA-C note as written.  Will plan LHC tentatively on Monday. INR increased somewhat. Continue heparin for LV thrombus. Ok to hold ACE-I until Monday.  Agree with colchicine for gout.  Chrystie Nose, MD, [facility] Attending Cardiologist [facility]

## 2011-11-19 NOTE — Progress Notes (Signed)
C/o rt. Foot pain. Denies injury. No obvious swelling or redness. Admits to having history of gout. [name], NP notified and will see on rounds.

## 2011-11-19 NOTE — Progress Notes (Signed)
  [id number]  

## 2011-11-19 NOTE — Progress Notes (Signed)
Nice gentleman with a history of ischemic cardiomyopathy s/p Bi-V AICD placement, history of CABG with multiple occluded grafts and PE on coumadin. He has been failing to thrive for several months. He was taken to [facility] by his wife and they told them there was nothing they could do. Prior EF was 40-45% in 3/12, up from 25-35% prior to the BI-V. Initially he improved, however, over the past few days he has had increasing cough, anorexia, orthopnea, PND and chest pressure. Labs indicate BNP ~6000. Exam + for bilateral coarse rhonchi and dullness at the bases, PMI is displaced laterally and precordial sounds are soft. JVD is ~10 cm H20. Admitted for diuresis  Subjective: No chest pain no SOB Objective: Vital signs in last 24 hours: Temp:  [97.5 F (36.4 C)-97.8 F (36.6 C)] 97.6 F (36.4 C) (04/05 0537) Pulse Rate:  [72-92] 92  (04/05 1036) Resp:  [18-20] 20  (04/05 0537) BP: (108-140)/(58-74) 126/74 mmHg (04/05 1036) SpO2:  [93 %-96 %] 96 % (04/05 0537) Weight:  [79.924 kg (176 lb 3.2 oz)] 79.924 kg (176 lb 3.2 oz) (04/05 0537) Weight change: -0.276 kg (-9.7 oz) Last BM Date: 11/14/11 Intake/Output from previous day: -340 04/04 0701 - 04/05 0700 In: 1261 [P.O.:1250; I.V.:3; IV Piggyback:8] Out: 1601 [Urine:1601] Intake/Output this shift: Total I/O In: 180 [P.O.:180] Out: -   PE: General:alert and oriented, complains of Rt. Foot pain mid foot soft tissue,  Neck:supple, no JVD Heart:S1S2 RRR, no murmur Lungs:+ Rhonchi no rales Abd:+ BS, soft, non tender Ext: no edema Wt. 79.924 today down from 83.6 on admit.   Tele: pacing   Lab Results: No results found for this basename: WBC:2,HGB:2,HCT:2,PLT:2 in the last 72 hours BMET  [facility] 11/18/11 0549 11/17/11 0559  NA 137 138  K 3.4* 3.7  CL 94* 97  CO2 34* 32  GLUCOSE 83 89  BUN 25* 25*  CREATININE 1.10 0.99  CALCIUM 9.7 9.1    Basename 11/16/11 1349  TROPONINI <0.30    Lab Results  Component Value Date   CHOL  Value: 105        ATP III CLASSIFICATION:  <200     mg/dL   Desirable  086-578  mg/dL   Borderline High  >=469    mg/dL   High        02/12/5283   HDL 23* 03/04/2010   LDLCALC  Value: 48        Total Cholesterol/HDL:CHD Risk Coronary Heart Disease Risk Table                     Men   Women  1/2 Average Risk   3.4   3.3  Average Risk       5.0   4.4  2 X Average Risk   9.6   7.1  3 X Average Risk  23.4   11.0        Use the calculated Patient Ratio above and the CHD Risk Table to determine the patient's CHD Risk.        ATP III CLASSIFICATION (LDL):  <100     mg/dL   Optimal  132-440  mg/dL   Near or Above                    Optimal  130-159  mg/dL   Borderline  102-725  mg/dL   High  >366     mg/dL   Very High 4/40/3474   TRIG 170* 03/04/2010  CHOLHDL 4.6 03/04/2010   Lab Results  Component Value Date   HGBA1C  Value: 6.8 (NOTE)                                                                       According to the ADA Clinical Practice Recommendations for 2011, when HbA1c is used as a screening test:   >=6.5%   Diagnostic of Diabetes Mellitus           (if abnormal result  is confirmed)  5.7-6.4%   Increased risk of developing Diabetes Mellitus  References:Diagnosis and Classification of Diabetes Mellitus,Diabetes Care,2011,34(Suppl 1):S62-S69 and Standards of Medical Care in         Diabetes - 2011,Diabetes Care,2011,34  (Suppl 1):S11-S61.* 08/11/2010     Lab Results  Component Value Date   TSH 1.439 08/11/2010    Hepatic Function Panel No results found for this basename: PROT,ALBUMIN,AST,ALT,ALKPHOS,BILITOT,BILIDIR,IBILI in the last 72 hours No results found for this basename: CHOL in the last 72 hours No results found for this basename: PROTIME in the last 72 hours    EKG: Orders placed during the hospital encounter of 11/15/11  . ED EKG  . ED EKG    Studies/Results: No results found.  Medications: I have reviewed the patient's current medications.    Marland Kitchen aspirin EC  81 mg Oral  Daily  . atorvastatin  10 mg Oral q1800  . azithromycin  250 mg Oral Daily  . digoxin  125 mcg Oral Daily  . docusate sodium  100 mg Oral BID  . escitalopram  20 mg Oral Daily  . fenofibrate  160 mg Oral Daily  . furosemide  80 mg Intravenous BID  . guaiFENesin  600 mg Oral BID  . hydrALAZINE  25 mg Oral TID  . insulin aspart  0-15 Units Subcutaneous TID WC  . insulin detemir  20 Units Subcutaneous QHS  . isosorbide dinitrate  10 mg Oral TID  . metoprolol tartrate  25 mg Oral Daily  . niacin  500 mg Oral QHS  . omega-3 acid ethyl esters  1 g Oral BID  . potassium chloride  40 mEq Oral Daily  . sodium chloride  3 mL Intravenous Q12H  . DISCONTD: potassium chloride  20 mEq Oral Daily  . DISCONTD: Warfarin - Pharmacist Dosing Inpatient   Does not apply q1800   Assessment/Plan: Patient Active Problem List  Diagnoses  . DM  . DYSLIPIDEMIA  . HYPERTENSION  . GERD  . DEGENERATIVE JOINT DISEASE  . SLEEP APNEA  . Chronic pulmonary embolism  . SYSTOLIC HEART FAILURE, CHRONIC  . CHRONIC OBSTRUCTIVE PULMONARY DISEASE, OXYGEN DEPENDENT  . PACEMAKER, PERMANENT  . Acute on chronic systolic congestive heart failure, BNP 6k  . Hypokalemia  . ICM last EF 40-45% 2D 8/12 after bi-V upgrade, (imrpoved from 20-25%), but now back to 20-25% BY ECHO 4/3/`13  . Biventricular ICD (implantable cardiac defibrillator) in place, 3/12  . Bronchitis, on ABs  . PE, DVT history  . Chronic anticoagulation for PE hx  . Bradycardia, original pacer placed 2008  . CAD, cabg 1994, multiple PCIs, last cath July 2011, med Rx, was reevaluated for REDO CABG   . Thrombocytopenia  . S/P renal artery angioplasty, and stent  Bilateral, patent by dopplers 08/2011   PLAN: Complains of foot pain, history of gout.  Labs P will also check uric acid.  No injury to foot. INR increased despite stopping yesterday. Lasix continues at 80 mg IV BID Plan for cath on Monday.  ? Hold Ace until post cath to prevent renal  failure?  Family concerned about ? Depression, flat affect.  Not eating much at all.  Add cholicine today for gout pain.  LOS: 4 days   Aldine Grainger R 11/19/2011, 11:31 AM

## 2011-11-20 LAB — GLUCOSE, CAPILLARY: Glucose-Capillary: 252 mg/dL — ABNORMAL HIGH (ref 70–99)

## 2011-11-20 LAB — BASIC METABOLIC PANEL
BUN: 32 mg/dL — ABNORMAL HIGH (ref 6–23)
CO2: 31 mEq/L (ref 19–32)
Glucose, Bld: 113 mg/dL — ABNORMAL HIGH (ref 70–99)
Potassium: 4 mEq/L (ref 3.5–5.1)
Sodium: 137 mEq/L (ref 135–145)

## 2011-11-20 MED ORDER — FUROSEMIDE 80 MG PO TABS
80.0000 mg | ORAL_TABLET | Freq: Two times a day (BID) | ORAL | Status: DC
  Administered 2011-11-20 – 2011-11-22 (×5): 80 mg via ORAL
  Filled 2011-11-20 (×8): qty 1

## 2011-11-20 MED ORDER — POTASSIUM CHLORIDE CRYS ER 20 MEQ PO TBCR
20.0000 meq | EXTENDED_RELEASE_TABLET | Freq: Every day | ORAL | Status: DC
  Administered 2011-11-20 – 2011-11-25 (×6): 20 meq via ORAL
  Filled 2011-11-20 (×7): qty 1

## 2011-11-20 NOTE — Progress Notes (Signed)
[facility]  DAILY PROGRESS NOTE   Subjective:  Substantial reduction in dyspnea, but not yet back to baseline. INR still supratherapeutic. No angina, bleeding or neurological events. Weight down 11 lb since admission.  Objective:  Temp:  [97.5 F (36.4 C)-98.7 F (37.1 C)] 97.7 F (36.5 C) (04/06 0553) Pulse Rate:  [79-92] 84  (04/06 1027) Resp:  [16-20] 18  (04/06 0553) BP: (103-132)/(49-81) 130/68 mmHg (04/06 1029) SpO2:  [93 %-96 %] 96 % (04/06 0553) Weight:  [78.608 kg (173 lb 4.8 oz)] 78.608 kg (173 lb 4.8 oz) (04/06 0553) Weight change: -1.316 kg (-2 lb 14.4 oz)  Intake/Output from previous day: 04/05 0701 - 04/06 0700 In: 360 [P.O.:360] Out: 1675 [Urine:1675]  Intake/Output from this shift:    Medications: Current Facility-Administered Medications  Medication Dose Route Frequency Provider Last Rate Last Dose  . 0.9 %  sodium chloride infusion  250 mL Intravenous PRN Dwana Melena, PA      . acetaminophen (TYLENOL) tablet 650 mg  650 mg Oral Q4H PRN Dwana Melena, PA      . ALPRAZolam Prudy Feeler) tablet 0.25 mg  0.25 mg Oral TID PRN Abelino Derrick, PA      . aspirin EC tablet 81 mg  81 mg Oral Daily Dwana Melena, PA   81 mg at 11/20/11 1026  . atorvastatin (LIPITOR) tablet 10 mg  10 mg Oral q1800 Dwana Melena, PA   10 mg at 11/19/11 1757  . azithromycin (ZITHROMAX) tablet 250 mg  250 mg Oral Daily Dwana Melena, PA   250 mg at 11/19/11 1037  . colchicine tablet 0.6 mg  0.6 mg Oral BID [name], NP   0.6 mg at 11/20/11 1027  . digoxin (LANOXIN) tablet 125 mcg  125 mcg Oral Daily Dwana Melena, PA   125 mcg at 11/20/11 1027  . docusate sodium (COLACE) capsule 100 mg  100 mg Oral BID [name], NP   100 mg at 11/20/11 1028  . escitalopram (LEXAPRO) tablet 20 mg  20 mg Oral Daily Dwana Melena, PA   20 mg at 11/20/11 1028  . famotidine (PEPCID) tablet 20 mg  20 mg Oral BID PRN Dwana Melena, PA      . fenofibrate tablet 160 mg  160 mg  Oral Daily Dwana Melena, PA   160 mg at 11/20/11 1029  . furosemide (LASIX) injection 80 mg  80 mg Intravenous BID Thurmon Fair, MD   80 mg at 11/20/11 0821  . guaiFENesin (MUCINEX) 12 hr tablet 600 mg  600 mg Oral BID Dwana Melena, PA   600 mg at 11/20/11 1029  . hydrALAZINE (APRESOLINE) tablet 25 mg  25 mg Oral TID Dwana Melena, PA   25 mg at 11/20/11 1029  . insulin aspart (novoLOG) injection 0-15 Units  0-15 Units Subcutaneous TID WC Dwana Melena, PA   2 Units at 11/19/11 1759  . insulin detemir (LEVEMIR) injection 20 Units  20 Units Subcutaneous QHS Dwana Melena, PA   20 Units at 11/19/11 2201  . isosorbide dinitrate (ISORDIL) tablet 10 mg  10 mg Oral TID Thurmon Fair, MD   10 mg at 11/20/11 1030  . lactulose (CHRONULAC) 10 GM/15ML solution 20 g  20 g Oral Daily PRN [name], NP   20 g at 11/20/11 1610  . metoprolol tartrate (LOPRESSOR) tablet 25 mg  25 mg Oral Daily Dwana Melena, [place]  25 mg at 11/19/11 1037  . niacin tablet 500 mg  500 mg Oral QHS Dwana Melena, PA   500 mg at 11/19/11 2202  . omega-3 acid ethyl esters (LOVAZA) capsule 1 g  1 g Oral BID Dwana Melena, PA   1 g at 11/19/11 2202  . ondansetron (ZOFRAN) injection 4 mg  4 mg Intravenous Q6H PRN Dwana Melena, PA      . sodium chloride 0.9 % injection 3 mL  3 mL Intravenous Q12H Dwana Melena, PA   3 mL at 11/19/11 2202  . sodium chloride 0.9 % injection 3 mL  3 mL Intravenous PRN Dwana Melena, PA      . zolpidem (AMBIEN) tablet 5 mg  5 mg Oral QHS PRN Abelino Derrick, PA      . DISCONTD: potassium chloride SA (K-DUR,KLOR-CON) CR tablet 20 mEq  20 mEq Oral Daily [name], NP      . DISCONTD: potassium chloride SA (K-DUR,KLOR-CON) CR tablet 40 mEq  40 mEq Oral Daily Chrystie Nose, MD   40 mEq at 11/19/11 1038    Physical Exam: General appearance: alert, cooperative and no distress Neck: no adenopathy, no carotid bruit, no JVD, supple, symmetrical, trachea midline and thyroid not enlarged, symmetric, no  tenderness/mass/nodules Lungs: clear to auscultation bilaterally Heart: normal apical impulse, regular rate and rhythm, S1, S2 normal and systolic murmur: early systolic 2/6, crescendo and decrescendo at 2nd right intercostal space Abdomen: soft, non-tender; bowel sounds normal; no masses,  no organomegaly Extremities: extremities normal, atraumatic, no cyanosis or edema Pulses: 2+ and symmetric Skin: Skin color, texture, turgor normal. No rashes or lesions Neurologic: Grossly normal  Lab Results: Results for orders placed during the hospital encounter of 11/15/11 (from the past 48 hour(s))  GLUCOSE, CAPILLARY     Status: Normal   Collection Time   11/18/11 10:58 AM      Component Value Range Comment   Glucose-Capillary 94  70 - 99 (mg/dL)    Comment 1 Notify RN     GLUCOSE, CAPILLARY     Status: Abnormal   Collection Time   11/18/11  4:21 PM      Component Value Range Comment   Glucose-Capillary 115 (*) 70 - 99 (mg/dL)    Comment 1 Notify RN     GLUCOSE, CAPILLARY     Status: Abnormal   Collection Time   11/18/11  8:56 PM      Component Value Range Comment   Glucose-Capillary 138 (*) 70 - 99 (mg/dL)    Comment 1 Notify RN      Comment 2 Documented in Chart     GLUCOSE, CAPILLARY     Status: Normal   Collection Time   11/19/11  5:46 AM      Component Value Range Comment   Glucose-Capillary 91  70 - 99 (mg/dL)    Comment 1 Notify RN      Comment 2 Documented in Chart     PROTIME-INR     Status: Abnormal   Collection Time   11/19/11  6:15 AM      Component Value Range Comment   Prothrombin Time 34.1 (*) 11.6 - 15.2 (seconds)    INR 3.31 (*) 0.00 - 1.49    GLUCOSE, CAPILLARY     Status: Abnormal   Collection Time   11/19/11 11:04 AM      Component Value Range Comment   Glucose-Capillary 110 (*) 70 - 99 (mg/dL)  Comment 1 Notify RN     URIC ACID     Status: Abnormal   Collection Time   11/19/11 11:32 AM      Component Value Range Comment   Uric Acid, Serum 9.8 (*) 4.0 - 7.8  (mg/dL)   BASIC METABOLIC PANEL     Status: Abnormal   Collection Time   11/19/11 11:32 AM      Component Value Range Comment   Sodium 136  135 - 145 (mEq/L)    Potassium 5.0  3.5 - 5.1 (mEq/L) HEMOLYSIS AT THIS LEVEL MAY AFFECT RESULT   Chloride 91 (*) 96 - 112 (mEq/L)    CO2 35 (*) 19 - 32 (mEq/L)    Glucose, Bld 113 (*) 70 - 99 (mg/dL)    BUN 25 (*) 6 - 23 (mg/dL)    Creatinine, Ser 1.61  0.50 - 1.35 (mg/dL)    Calcium 09.6  8.4 - 10.5 (mg/dL)    GFR calc non Af Amer 54 (*) >90 (mL/min)    GFR calc Af Amer 63 (*) >90 (mL/min)   GLUCOSE, CAPILLARY     Status: Abnormal   Collection Time   11/19/11  4:04 PM      Component Value Range Comment   Glucose-Capillary 135 (*) 70 - 99 (mg/dL)    Comment 1 Notify RN     POTASSIUM     Status: Normal   Collection Time   11/19/11  6:41 PM      Component Value Range Comment   Potassium 4.2  3.5 - 5.1 (mEq/L)   GLUCOSE, CAPILLARY     Status: Abnormal   Collection Time   11/19/11  9:01 PM      Component Value Range Comment   Glucose-Capillary 122 (*) 70 - 99 (mg/dL)    Comment 1 Notify RN     PROTIME-INR     Status: Abnormal   Collection Time   11/20/11  5:00 AM      Component Value Range Comment   Prothrombin Time 35.3 (*) 11.6 - 15.2 (seconds)    INR 3.46 (*) 0.00 - 1.49    BASIC METABOLIC PANEL     Status: Abnormal   Collection Time   11/20/11  5:00 AM      Component Value Range Comment   Sodium 137  135 - 145 (mEq/L)    Potassium 4.0  3.5 - 5.1 (mEq/L)    Chloride 94 (*) 96 - 112 (mEq/L)    CO2 31  19 - 32 (mEq/L)    Glucose, Bld 113 (*) 70 - 99 (mg/dL)    BUN 32 (*) 6 - 23 (mg/dL)    Creatinine, Ser 0.45  0.50 - 1.35 (mg/dL)    Calcium 40.9  8.4 - 10.5 (mg/dL)    GFR calc non Af Amer 50 (*) >90 (mL/min)    GFR calc Af Amer 58 (*) >90 (mL/min)     Imaging: No results found.  Assessment:  1. Principal Problem: 2.  *Acute on chronic systolic congestive heart failure, BNP 6k 3. Active Problems: 4.  DM 5.  DYSLIPIDEMIA 6.   HYPERTENSION 7.  SLEEP APNEA 8.  ICM last EF 40-45% 2D 8/12 after bi-V upgrade, (imrpoved from 20-25%), but now back to 20-25% BY ECHO 4/3/`13 9.  Biventricular ICD (implantable cardiac defibrillator) in place, 3/12 10.  Bronchitis, on ABs 11.  PE, DVT history 12.  Chronic anticoagulation for PE hx 13.  Bradycardia, original pacer placed 2008 14.  CAD, cabg  1994, multiple PCIs, last cath July 2011, med Rx, was reevaluated for REDO CABG  15.  Thrombocytopenia 16.  S/P renal artery angioplasty, and stent Bilateral, patent by dopplers 08/2011 17.   Plan:  1. Substantial deterioration in LVEF and new LV apical thrombus in patient with extensive CAD (both native and graft). New wall motion abnormalities are seen in the inferoposterior distribution (likely total occlusion of diseased sequential PDA-PLA graft), but also in the apical territory (LAD downstream of LIMA insertion?). 2. Plan cath when INR<1.7. Needs femoral or left radial approach due to grafts 3. Resume ACE inh and warfarin after cath  4. Switch to po diuretics  Time Spent Directly with Patient:  45 minutes  Length of Stay:  LOS: 5 days    Jailyn Langhorst 11/20/2011, 10:31 AM

## 2011-11-20 NOTE — Progress Notes (Signed)
ANTICOAGULATION CONSULT NOTE - Follow Up Consult  Pharmacy Consult for heparin Indication: history of PE  Allergies  Allergen Reactions  . Motrin (Ibuprofen) Other (See Comments)    Put hole in stomach    Patient Measurements: Height: 5\' 5"  (165.1 cm) Weight: 173 lb 4.8 oz (78.608 kg) (c scale) IBW/kg (Calculated) : 61.5    Vital Signs: Temp: 97.7 F (36.5 C) (04/06 0553) Temp src: Oral (04/06 0553) BP: 132/71 mmHg (04/06 0553) Pulse Rate: 91  (04/06 0553)  Labs:  Basename 11/20/11 0500 11/19/11 1132 11/19/11 0615 11/18/11 0549  HGB -- -- -- --  HCT -- -- -- --  PLT -- -- -- --  APTT -- -- -- --  LABPROT 35.3* -- 34.1* 29.4*  INR 3.46* -- 3.31* 2.73*  HEPARINUNFRC -- -- -- --  CREATININE 1.27 1.19 -- 1.10  CKTOTAL -- -- -- --  CKMB -- -- -- --  TROPONINI -- -- -- --   Estimated Creatinine Clearance: 41.8 ml/min (by C-G formula based on Cr of 1.27).   Medications:  Scheduled:     . aspirin EC  81 mg Oral Daily  . atorvastatin  10 mg Oral q1800  . azithromycin  250 mg Oral Daily  . colchicine  0.6 mg Oral BID  . digoxin  125 mcg Oral Daily  . docusate sodium  100 mg Oral BID  . escitalopram  20 mg Oral Daily  . fenofibrate  160 mg Oral Daily  . furosemide  80 mg Intravenous BID  . guaiFENesin  600 mg Oral BID  . hydrALAZINE  25 mg Oral TID  . insulin aspart  0-15 Units Subcutaneous TID WC  . insulin detemir  20 Units Subcutaneous QHS  . isosorbide dinitrate  10 mg Oral TID  . metoprolol tartrate  25 mg Oral Daily  . niacin  500 mg Oral QHS  . omega-3 acid ethyl esters  1 g Oral BID  . sodium chloride  3 mL Intravenous Q12H  . DISCONTD: potassium chloride  20 mEq Oral Daily  . DISCONTD: potassium chloride  40 mEq Oral Daily    Assessment: 78 yo male on coumadin PTA for history of PE - being held for scheduled Cath on Monday 04/08.  Plan for heparin to start when INR falls <2.0.  Last dose coumadin 04/03, however INR continues to rise - now 3.46.  No  bleeding noted.  Heparin has not started yet.    Goal of Therapy:  INR 2-3   Plan:  -hold coumadin -Continue to follow INR for heparin start  Hara Milholland E 11/20/2011,10:15 AM

## 2011-11-20 NOTE — Progress Notes (Signed)
Pharmacy consulted to verify Medication Reconciliation per family request to MD.    Medications which are different from PTA med list (according to Wife) Lasix: 2 tablets (80mg ) po BID - recently increased Lopressor 25mg  po BID Glipizide 5mg  daily Crestor 10mg  daily (given samples from MD)  Pt has not picked up or filled the following medications since at least Nov 2012:  Hydralazine, Isosorbide, Nevibilol  Pharmacy ([facility] in [place]) unable to verify Coumadin, Fish oil, niacin, or potassium.  Wife states no other pharmacy used.  Labels from home medications would be useful to find out last fill date for these prescriptions.    Please let us know if you need further assistance.  Thanks! Haynes Hoehn, PharmD 11/20/2011 3:38 PM  Pager: [phone]

## 2011-11-21 ENCOUNTER — Encounter (HOSPITAL_COMMUNITY): Payer: Self-pay | Admitting: Cardiology

## 2011-11-21 DIAGNOSIS — J209 Acute bronchitis, unspecified: Secondary | ICD-10-CM

## 2011-11-21 DIAGNOSIS — M109 Gout, unspecified: Secondary | ICD-10-CM

## 2011-11-21 DIAGNOSIS — J9610 Chronic respiratory failure, unspecified whether with hypoxia or hypercapnia: Secondary | ICD-10-CM | POA: Diagnosis present

## 2011-11-21 DIAGNOSIS — I513 Intracardiac thrombosis, not elsewhere classified: Secondary | ICD-10-CM | POA: Clinically undetermined

## 2011-11-21 HISTORY — DX: Gout, unspecified: M10.9

## 2011-11-21 HISTORY — DX: Chronic respiratory failure, unspecified whether with hypoxia or hypercapnia: J96.10

## 2011-11-21 HISTORY — DX: Acute bronchitis, unspecified: J20.9

## 2011-11-21 HISTORY — DX: Intracardiac thrombosis, not elsewhere classified: I51.3

## 2011-11-21 LAB — BASIC METABOLIC PANEL
BUN: 39 mg/dL — ABNORMAL HIGH (ref 6–23)
CO2: 33 mEq/L — ABNORMAL HIGH (ref 19–32)
Chloride: 93 mEq/L — ABNORMAL LOW (ref 96–112)
Creatinine, Ser: 1.39 mg/dL — ABNORMAL HIGH (ref 0.50–1.35)
Glucose, Bld: 92 mg/dL (ref 70–99)

## 2011-11-21 LAB — HEPATIC FUNCTION PANEL
ALT: 11 U/L (ref 0–53)
Alkaline Phosphatase: 57 U/L (ref 39–117)
Bilirubin, Direct: 0.2 mg/dL (ref 0.0–0.3)
Indirect Bilirubin: 0.4 mg/dL (ref 0.3–0.9)
Total Bilirubin: 0.6 mg/dL (ref 0.3–1.2)
Total Protein: 7.1 g/dL (ref 6.0–8.3)

## 2011-11-21 LAB — GLUCOSE, CAPILLARY
Glucose-Capillary: 171 mg/dL — ABNORMAL HIGH (ref 70–99)
Glucose-Capillary: 85 mg/dL (ref 70–99)
Glucose-Capillary: 159 mg/dL — ABNORMAL HIGH (ref 70–99)

## 2011-11-21 MED ORDER — LEVALBUTEROL HCL 0.63 MG/3ML IN NEBU
0.6300 mg | INHALATION_SOLUTION | Freq: Three times a day (TID) | RESPIRATORY_TRACT | Status: DC
  Administered 2011-11-21 – 2011-11-25 (×11): 0.63 mg via RESPIRATORY_TRACT
  Filled 2011-11-21 (×15): qty 3

## 2011-11-21 MED ORDER — COLCHICINE 0.6 MG PO TABS
0.6000 mg | ORAL_TABLET | Freq: Every day | ORAL | Status: DC
  Administered 2011-11-22 – 2011-11-25 (×4): 0.6 mg via ORAL
  Filled 2011-11-21 (×4): qty 1

## 2011-11-21 NOTE — Progress Notes (Signed)
ANTICOAGULATION CONSULT NOTE - Follow Up Consult  Pharmacy Consult for heparin Indication: history of PE  Assessment: 78 yo male on coumadin PTA for history of PE - being held for scheduled Cath on Monday 04/08.  Plan for heparin to start when INR falls <2.0.  Last dose coumadin 04/03 - INR rose yesterday despite 3 days off, however starting to trend down today - currently 2.95. No bleeding noted.  Heparin has not started yet.    Goal of Therapy:  INR 2-3   Plan:  -Hold coumadin -Continue to follow INR for heparin start  Leotha Westermeyer E 11/21/2011,9:20 AM   Allergies  Allergen Reactions  . Motrin (Ibuprofen) Other (See Comments)    Put hole in stomach    Patient Measurements: Height: 5\' 5"  (165.1 cm) Weight: 172 lb 6.4 oz (78.2 kg) IBW/kg (Calculated) : 61.5    Vital Signs: Temp: 98 F (36.7 C) (04/07 0402) Temp src: Oral (04/07 0402) BP: 113/47 mmHg (04/07 0402) Pulse Rate: 84  (04/07 0402)  Labs:  Basename 11/21/11 0525 11/20/11 0500 11/19/11 1132 11/19/11 0615  HGB -- -- -- --  HCT -- -- -- --  PLT -- -- -- --  APTT -- -- -- --  LABPROT 31.2* 35.3* -- 34.1*  INR 2.95* 3.46* -- 3.31*  HEPARINUNFRC -- -- -- --  CREATININE 1.39* 1.27 1.19 --  CKTOTAL -- -- -- --  CKMB -- -- -- --  TROPONINI -- -- -- --   Estimated Creatinine Clearance: 38.2 ml/min (by C-G formula based on Cr of 1.39).   Medications:  Scheduled:     . aspirin EC  81 mg Oral Daily  . atorvastatin  10 mg Oral q1800  . colchicine  0.6 mg Oral BID  . digoxin  125 mcg Oral Daily  . docusate sodium  100 mg Oral BID  . escitalopram  20 mg Oral Daily  . fenofibrate  160 mg Oral Daily  . furosemide  80 mg Oral BID  . guaiFENesin  600 mg Oral BID  . hydrALAZINE  25 mg Oral TID  . insulin aspart  0-15 Units Subcutaneous TID WC  . insulin detemir  20 Units Subcutaneous QHS  . isosorbide dinitrate  10 mg Oral TID  . metoprolol tartrate  25 mg Oral Daily  . niacin  500 mg Oral QHS  . omega-3  acid ethyl esters  1 g Oral BID  . potassium chloride  20 mEq Oral Daily  . sodium chloride  3 mL Intravenous Q12H  . DISCONTD: furosemide  80 mg Intravenous BID  . DISCONTD: potassium chloride  20 mEq Oral Daily

## 2011-11-21 NOTE — Progress Notes (Addendum)
Subjective: Pt stated he felt better, was eating more yesterday and today.  Had large BM.   Objective: Vital signs in last 24 hours: Temp:  [97.7 F (36.5 C)-98.2 F (36.8 C)] 97.9 F (36.6 C) (04/07 0941) Pulse Rate:  [75-88] 85  (04/07 0941) Resp:  [16-18] 18  (04/07 0941) BP: (104-133)/(47-68) 108/62 mmHg (04/07 0941) SpO2:  [94 %-98 %] 98 % (04/07 0941) Weight:  [78.2 kg (172 lb 6.4 oz)] 78.2 kg (172 lb 6.4 oz) (04/07 0402) Weight change: -0.408 kg (-14.4 oz) Last BM Date: 11/20/11 Intake/Output from previous day:  -61? Accuracy? 04/06 0701 - 04/07 0700 In: 880 [P.O.:880] Out: 941 [Urine:940; Stool:1] Intake/Output this shift: Total I/O In: 240 [P.O.:240] Out: -   PE: General:alert and Oriented affect pleasant more interactive Neck:minimal JVD Heart:S1S2 RRR, pacing,   Lungs:+[employer].   Abd:+ BS, soft non tender Ext:rt. Foot with no erythema no pain to palpation  Weight 78.2  Admit wt. 83.6   Lab Results: No results found for this basename: WBC:2,HGB:2,HCT:2,PLT:2 in the last 72 hours BMET  Basename 11/21/11 0525 11/20/11 0500  NA 136 137  K 3.6 4.0  CL 93* 94*  CO2 33* 31  GLUCOSE 92 113*  BUN 39* 32*  CREATININE 1.39* 1.27  CALCIUM 9.8 10.0   No results found for this basename: TROPONINI:2,CK,MB:2 in the last 72 hours  Lab Results  Component Value Date   CHOL  Value: 105        ATP III CLASSIFICATION:  <200     mg/dL   Desirable  841-324  mg/dL   Borderline High  >=401    mg/dL   High        0/27/2536   HDL 23* 03/04/2010   LDLCALC  Value: 48        Total Cholesterol/HDL:CHD Risk Coronary Heart Disease Risk Table                     Men   Women  1/2 Average Risk   3.4   3.3  Average Risk       5.0   4.4  2 X Average Risk   9.6   7.1  3 X Average Risk  23.4   11.0        Use the calculated Patient Ratio above and the CHD Risk Table to determine the patient's CHD Risk.        ATP III CLASSIFICATION (LDL):  <100     mg/dL   Optimal  644-034  mg/dL    Near or Above                    Optimal  130-159  mg/dL   Borderline  742-595  mg/dL   High  >638     mg/dL   Very High 7/56/4332   TRIG 170* 03/04/2010   CHOLHDL 4.6 03/04/2010   Lab Results  Component Value Date   HGBA1C  Value: 6.8 (NOTE)                                                                       According to the ADA Clinical Practice Recommendations for 2011, when HbA1c is used as a  screening test:   >=6.5%   Diagnostic of Diabetes Mellitus           (if abnormal result  is confirmed)  5.7-6.4%   Increased risk of developing Diabetes Mellitus  References:Diagnosis and Classification of Diabetes Mellitus,Diabetes Care,2011,34(Suppl 1):S62-S69 and Standards of Medical Care in         Diabetes - 2011,Diabetes Care,2011,34  (Suppl 1):S11-S61.* 08/11/2010     Lab Results  Component Value Date   TSH 1.439 08/11/2010    Hepatic Function Panel  Basename 11/21/11 0525  PROT 7.1  ALBUMIN 3.1*  AST 21  ALT 11  ALKPHOS 57  BILITOT 0.6  BILIDIR 0.2  IBILI 0.4   No results found for this basename: CHOL in the last 72 hours No results found for this basename: PROTIME in the last 72 hours    EKG: Orders placed during the hospital encounter of 11/15/11  . ED EKG  . ED EKG    Studies/Results: No results found.  Medications: I have reviewed the patient's current medications.    Marland Kitchen aspirin EC  81 mg Oral Daily  . atorvastatin  10 mg Oral q1800  . colchicine  0.6 mg Oral BID  . digoxin  125 mcg Oral Daily  . docusate sodium  100 mg Oral BID  . escitalopram  20 mg Oral Daily  . fenofibrate  160 mg Oral Daily  . furosemide  80 mg Oral BID  . guaiFENesin  600 mg Oral BID  . hydrALAZINE  25 mg Oral TID  . insulin aspart  0-15 Units Subcutaneous TID WC  . insulin detemir  20 Units Subcutaneous QHS  . isosorbide dinitrate  10 mg Oral TID  . metoprolol tartrate  25 mg Oral Daily  . niacin  500 mg Oral QHS  . omega-3 acid ethyl esters  1 g Oral BID  . potassium chloride  20  mEq Oral Daily  . sodium chloride  3 mL Intravenous Q12H  . DISCONTD: furosemide  80 mg Intravenous BID   Assessment/Plan: Patient Active Problem List  Diagnoses  . DM  . DYSLIPIDEMIA  . HYPERTENSION  . GERD  . DEGENERATIVE JOINT DISEASE  . SLEEP APNEA  . Chronic pulmonary embolism  . SYSTOLIC HEART FAILURE, CHRONIC  . CHRONIC OBSTRUCTIVE PULMONARY DISEASE, OXYGEN DEPENDENT  . PACEMAKER, PERMANENT  . Acute on chronic systolic congestive heart failure, BNP 6k  . Hypokalemia  . ICM last EF 40-45% 2D 8/12 after bi-V upgrade, (imrpoved from 20-25%), but now back to 20-25% BY ECHO 4/3/`13  . Biventricular ICD (implantable cardiac defibrillator) in place, 3/12  . Bronchitis, on ABs  . PE, DVT history  . Chronic anticoagulation for PE hx  . Bradycardia, original pacer placed 2008  . CAD, cabg 1994, multiple PCIs, last cath July 2011, med Rx, was reevaluated for REDO CABG   . Thrombocytopenia  . S/P renal artery angioplasty, and stent Bilateral, patent by dopplers 08/2011  . Acute bronchitis, treated with nebs and ABX  . Chronic respiratory failure, is on home oxygen  . Apical mural thrombus   PLAN:  Cr. Climbing- decrease lasix INR slowly drifting down. Pt. had poor appetite- improved after he had BM. Gout-- with rt. Foot pain improved will decrease colchicine.  Add glipizide back after cath.  Plan will be for cath once INR to 1.7 or less in this gentleman with severe CAD and severe graft dysfunction. Was evlauated for redo CABG on last cath in July 2011, Medical therapy was  decided- do not know if this was family or Surgical decision but concern that native LAD has increased stenosis.    LOS: 6 days   INGOLD,LAURA R 11/21/2011, 10:41 AM   I have seen and examined the patient along with [facility] R, NP.  I have reviewed the chart, notes and new data.  I agree with NP's note.  Key new complaints: Feels better. Not walking around, but no dyspnea at rest; gout symptoms  resolved Key examination changes: no JVD, no S3 Key new findings / data: INR finally starting to drop, but will not be low enough for safe femoral access cath by tomorrow.  Substantial deterioration in LVEF and new LV apical thrombus in patient with extensive CAD (both native and graft). New wall motion abnormalities are seen in the inferoposterior distribution (likely total occlusion of diseased sequential PDA-PLA graft), but also in the apical territory (LAD has high grade stenosis).   Will review prior cath films.   Thurmon Fair, MD, [facility] [phone] 11/21/2011, 11:40 AM

## 2011-11-22 LAB — GLUCOSE, CAPILLARY
Glucose-Capillary: 102 mg/dL — ABNORMAL HIGH (ref 70–99)
Glucose-Capillary: 188 mg/dL — ABNORMAL HIGH (ref 70–99)
Glucose-Capillary: 83 mg/dL (ref 70–99)
Glucose-Capillary: 144 mg/dL — ABNORMAL HIGH (ref 70–99)

## 2011-11-22 LAB — BASIC METABOLIC PANEL
BUN: 44 mg/dL — ABNORMAL HIGH (ref 6–23)
CO2: 29 mEq/L (ref 19–32)
Chloride: 93 mEq/L — ABNORMAL LOW (ref 96–112)
GFR calc non Af Amer: 47 mL/min — ABNORMAL LOW (ref >90)
Glucose, Bld: 107 mg/dL — ABNORMAL HIGH (ref 70–99)
Potassium: 3.7 mEq/L (ref 3.5–5.1)
Sodium: 134 mEq/L — ABNORMAL LOW (ref 135–145)

## 2011-11-22 LAB — HEPARIN LEVEL (UNFRACTIONATED): Heparin Unfractionated: 0.25 [IU]/mL — ABNORMAL LOW (ref 0.30–0.70)

## 2011-11-22 LAB — PRO B NATRIURETIC PEPTIDE: Pro B Natriuretic peptide (BNP): 2729 pg/mL — ABNORMAL HIGH (ref 0–450)

## 2011-11-22 MED ORDER — HEPARIN (PORCINE) IN NACL 100-0.45 UNIT/ML-% IJ SOLN
1100.0000 [IU]/h | INTRAMUSCULAR | Status: DC
  Administered 2011-11-22: 900 [IU]/h via INTRAVENOUS
  Filled 2011-11-22 (×4): qty 250

## 2011-11-22 NOTE — Progress Notes (Signed)
[facility]  Subjective: No Complaints.  Objective: Vital signs in last 24 hours: Temp:  [97.3 F (36.3 C)-98.1 F (36.7 C)] 98.1 F (36.7 C) (04/08 0415) Pulse Rate:  [76-87] 87  (04/08 0415) Resp:  [18] 18  (04/08 0415) BP: (108-124)/(61-62) 124/62 mmHg (04/08 0415) SpO2:  [93 %-99 %] 96 % (04/08 0823) FiO2 (%):  [32 %] 32 % (04/07 2102) Weight:  [78.1 kg (172 lb 2.9 oz)] 78.1 kg (172 lb 2.9 oz) (04/08 0415) Last BM Date: 11/20/11  Intake/Output from previous day: 04/07 0701 - 04/08 0700 In: 480 [P.O.:480] Out: 1550 [Urine:1550] Intake/Output this shift: Total I/O In: -  Out: 150 [Urine:150]  Medications Current Facility-Administered Medications  Medication Dose Route Frequency Provider Last Rate Last Dose  . 0.9 %  sodium chloride infusion  250 mL Intravenous PRN Dwana Melena, PA      . acetaminophen (TYLENOL) tablet 650 mg  650 mg Oral Q4H PRN Dwana Melena, PA      . ALPRAZolam Prudy Feeler) tablet 0.25 mg  0.25 mg Oral TID PRN Abelino Derrick, PA      . aspirin EC tablet 81 mg  81 mg Oral Daily Dwana Melena, PA   81 mg at 11/21/11 1015  . atorvastatin (LIPITOR) tablet 10 mg  10 mg Oral q1800 Dwana Melena, PA   10 mg at 11/21/11 1712  . colchicine tablet 0.6 mg  0.6 mg Oral Daily [name], NP      . digoxin [facility]) tablet 125 mcg  125 mcg Oral Daily Dwana Melena, PA   125 mcg at 11/21/11 1016  . docusate sodium (COLACE) capsule 100 mg  100 mg Oral BID [name], NP   100 mg at 11/21/11 2128  . escitalopram (LEXAPRO) tablet 20 mg  20 mg Oral Daily Dwana Melena, PA   20 mg at 11/21/11 1016  . famotidine (PEPCID) tablet 20 mg  20 mg Oral BID PRN Dwana Melena, PA      . fenofibrate tablet 160 mg  160 mg Oral Daily Dwana Melena, PA   160 mg at 11/21/11 1016  . furosemide (LASIX) tablet 80 mg  80 mg Oral BID Mihai Croitoru, MD   80 mg at 11/22/11 0835  . guaiFENesin (MUCINEX) 12 hr tablet 600 mg  600 mg Oral BID Dwana Melena, PA   600  mg at 11/21/11 2129  . hydrALAZINE (APRESOLINE) tablet 25 mg  25 mg Oral TID Dwana Melena, PA   25 mg at 11/21/11 2128  . insulin aspart (novoLOG) injection 0-15 Units  0-15 Units Subcutaneous TID WC Dwana Melena, PA   3 Units at 11/21/11 1634  . insulin detemir (LEVEMIR) injection 20 Units  20 Units Subcutaneous QHS Dwana Melena, PA   20 Units at 11/21/11 2131  . isosorbide dinitrate (ISORDIL) tablet 10 mg  10 mg Oral TID Thurmon Fair, MD   10 mg at 11/21/11 2128  . lactulose (CHRONULAC) 10 GM/15ML solution 20 g  20 g Oral Daily PRN [name], NP   20 g at 11/20/11 1610  . levalbuterol (XOPENEX) nebulizer solution 0.63 mg  0.63 mg Nebulization TID [name], NP   0.63 mg at 11/22/11 9604  . metoprolol tartrate (LOPRESSOR) tablet 25 mg  25 mg Oral Daily Dwana Melena, PA   25 mg at 11/21/11 1015  . niacin tablet 500 mg  500 mg Oral QHS Judie Grieve  Otho Bellows, PA   500 mg at 11/21/11 2128  . omega-3 acid ethyl esters (LOVAZA) capsule 1 g  1 g Oral BID Dwana Melena, PA   1 g at 11/21/11 2128  . ondansetron (ZOFRAN) injection 4 mg  4 mg Intravenous Q6H PRN Dwana Melena, PA      . potassium chloride SA (K-DUR,KLOR-CON) CR tablet 20 mEq  20 mEq Oral Daily Mihai Croitoru, MD   20 mEq at 11/21/11 1015  . sodium chloride 0.9 % injection 3 mL  3 mL Intravenous Q12H Dwana Melena, PA   3 mL at 11/21/11 2131  . sodium chloride 0.9 % injection 3 mL  3 mL Intravenous PRN Dwana Melena, PA   3 mL at 11/21/11 1015  . zolpidem (AMBIEN) tablet 5 mg  5 mg Oral QHS PRN Abelino Derrick, PA      . DISCONTD: colchicine tablet 0.6 mg  0.6 mg Oral BID [name], NP   0.6 mg at 11/21/11 1016    PE: General appearance: alert, cooperative and no distress Lungs: clear to auscultation bilaterally Heart: regular rate and rhythm, S1, S2 normal, no murmur, click, rub or gallop Extremities: No LEE Pulses: Radials and right DP 2+, Left DP not palpable but foot is nice and warm.  Lab Results:  No results found for this  basename: WBC:3,HGB:3,HCT:3,PLT:3 in the last 72 hours BMET  Basename 11/22/11 0545 11/21/11 0525 11/20/11 0500  NA 134* 136 137  K 3.7 3.6 4.0  CL 93* 93* 94*  CO2 29 33* 31  GLUCOSE 107* 92 113*  BUN 44* 39* 32*  CREATININE 1.33 1.39* 1.27  CALCIUM 9.7 9.8 10.0   PT/INR  Basename 11/22/11 0545 11/21/11 0525 11/20/11 0500  LABPROT 22.8* 31.2* 35.3*  INR 1.97* 2.95* 3.46*    Assessment/Plan    Principal Problem:  *DYSLIPIDEMIA Active Problems:  DM  HYPERTENSION  SLEEP APNEA  Acute on chronic systolic congestive heart failure, BNP 6k  ICM last EF 40-45% 2D 8/12 after bi-V upgrade, (imrpoved from 20-25%), but now back to 20-25% BY ECHO 4/3/`13  Biventricular ICD (implantable cardiac defibrillator) in place, 3/12  PE, DVT history  Chronic anticoagulation for PE hx  CAD, cabg 1994, multiple PCIs, last cath July 2011, med Rx, was reevaluated for REDO CABG   Thrombocytopenia  S/P renal artery angioplasty, and stent Bilateral, patent by dopplers 08/2011  Acute bronchitis, treated with nebs and ABX  Chronic respiratory failure, is on home oxygen  Apical mural thrombus  Gout flare, 11/19/11  Plan: Plan will be for cath once INR to 1.7 or less in this gentleman with severe CAD and severe graft dysfunction.  INR 1.97 today.  Maybe cath tomorrow.  Was evlauated for redo CABG on last cath in July 2011, Medical therapy was decided- do not know if this was family or Surgical decision but concern that native LAD has increased stenosis.  Net fluids negative yesterday.  BP ok: 108/62 - 124/62.  SCr stable.  EF now 20-25% but was 40-45%     LOS: 7 days    HAGER,BRYAN W 11/22/2011 9:13 AM  ATTENDING ATTESTATION:  I have seen and examined the patient along with Wilburt Finlay, PA.  I have reviewed the chart, notes and new data.  I agree with Bryan's note.  Brief Description: 78 y/o with distant H/o CABG (> 20 yrs ago) - only remaining conduit is native LAD --> admitted with CHF  (acute on chronic combined  Systolic/Diastolic with worsening EF & new WMA suggesting new ischemic territory (Substantial deterioration in LVEF and new LV apical thrombus in patient with extensive CAD (both native and graft). New wall motion abnormalities are seen in the inferoposterior distribution (likely total occlusion of diseased sequential PDA-PLA graft), but also in the apical territory (LAD has high grade stenosis).  Has diuresed well.  INR trending down (on warfarin for h/o PE) - 1.9 today.   Key new complaints: Feels the best he has in months.  No CP, SOB or DOE (with mild exertion)  Key examination changes: no edema, no JVD or S3, no rales.  RRR. Pleasant mood & affect.  Key new findings / data: INR noted.  Renal function stable.  PLAN:  Continue with PO diuretic for recent CHF exacerbation. - renal function stable  Will plan for [employer] +/- possible high-risk PTCA?PCI (with possible Rotablator atherectomy) -- earliest time would be tomorrow if INR < ~1.7.  I explained the procedure with R/B/A/I & he & family are in agreement to proceed.  Has ICD - so no need for Temp PM for Rotablator.  With INR < 2.0 - will start Heparin IV given PE & LV Thrombus indication for warfarin, hold for cath in AM.  No c/o further gout pain  Continue O2 as per home O2 regimen  Ibtisam Benge W, M.D., M.S. [facility] [address] [place], [place]  [address]  [phone]  11/22/2011 9:43 AM

## 2011-11-22 NOTE — Progress Notes (Addendum)
ANTICOAGULATION CONSULT NOTE - Follow Up Consult  Pharmacy Consult for heparin Indication: history of PE  Allergies  Allergen Reactions  . Motrin (Ibuprofen) Other (See Comments)    Put hole in stomach    Patient Measurements: Height: 5\' 5"  (165.1 cm) Weight: 172 lb 2.9 oz (78.1 kg) (c scale) IBW/kg (Calculated) : 61.5  Heparin Dosing Weight: 77.2kg  Vital Signs: Temp: 98 F (36.7 C) (04/08 1438) Temp src: Oral (04/08 1438) BP: 117/60 mmHg (04/08 1438) Pulse Rate: 74  (04/08 1438)  Labs:  Basename 11/22/11 1840 11/22/11 0545 11/21/11 0525 11/20/11 0500  HGB -- -- -- --  HCT -- -- -- --  PLT -- -- -- --  APTT -- -- -- --  LABPROT -- 22.8* 31.2* 35.3*  INR -- 1.97* 2.95* 3.46*  HEPARINUNFRC 0.25* -- -- --  CREATININE -- 1.33 1.39* 1.27  CKTOTAL -- -- -- --  CKMB -- -- -- --  TROPONINI -- -- -- --   Estimated Creatinine Clearance: 39.8 ml/min (by C-G formula based on Cr of 1.33).   Medications:  Infusions:    . heparin 900 Units/hr (11/22/11 1134)    Assessment: 86 yom started on chronic coumadin PTA for history of PE. Started on IV heparin since INR<2 in preparation for cardiac cath. First heparin level is subtherapeutic at 0.25. No bleeding or other problems noted. Plan for cardiac cath in AM.   Goal of Therapy:  Heparin level 0.3-0.7 units/ml   Plan:  1. Increase heparin to 1100 units/hr 2. Check 8 hour heparin level 3. F/u cath plans  Sumiya Mamaril, Drake Leach 11/22/2011,7:27 PM

## 2011-11-22 NOTE — Progress Notes (Signed)
ANTICOAGULATION CONSULT NOTE - Follow Up Consult  Pharmacy Consult for heparin Indication: history of PE  Assessment: 78 yo male on coumadin PTA for history of PE - being held for scheduled Cath when INR < 1.7.  Plan for heparin to start when INR falls <2.0.  Last dose coumadin 04/03 - INR is 1.97 today which is trending down. No bleeding noted.  Heparin to start today to minimize thrombotic event while off Warfarin given history of PE..    Goal of Therapy:  INR 2-3   Plan:  - Begin IV heparin without a bolus at 900 units/hr - Check a heparin level in 8 hours to ensure therapeutic response. - Continue to follow INR and plans for restart post cardiac cath.  Nadara Mustard, PharmD.,MS Clinical Pharmacist Pager:   [phone]  11/22/2011 08:38AM   Allergies  Allergen Reactions  . Motrin (Ibuprofen) Other (See Comments)    Put hole in stomach    Patient Measurements: Height: 5\' 5"  (165.1 cm) Weight: 172 lb 2.9 oz (78.1 kg) (c scale) IBW/kg (Calculated) : 61.5    Vital Signs: Temp: 98.1 F (36.7 C) (04/08 0415) Temp src: Oral (04/08 0415) BP: 124/62 mmHg (04/08 0415) Pulse Rate: 87  (04/08 0415)  Labs:  Alvira Philips 11/22/11 0545 11/21/11 0525 11/20/11 0500  HGB -- -- --  HCT -- -- --  PLT -- -- --  APTT -- -- --  LABPROT 22.8* 31.2* 35.3*  INR 1.97* 2.95* 3.46*  HEPARINUNFRC -- -- --  CREATININE 1.33 1.39* 1.27  CKTOTAL -- -- --  CKMB -- -- --  TROPONINI -- -- --   Estimated Creatinine Clearance: 39.8 ml/min (by C-G formula based on Cr of 1.33).   Medications:  Scheduled:     . aspirin EC  81 mg Oral Daily  . atorvastatin  10 mg Oral q1800  . colchicine  0.6 mg Oral Daily  . digoxin  125 mcg Oral Daily  . docusate sodium  100 mg Oral BID  . escitalopram  20 mg Oral Daily  . fenofibrate  160 mg Oral Daily  . furosemide  80 mg Oral BID  . guaiFENesin  600 mg Oral BID  . hydrALAZINE  25 mg Oral TID  . insulin aspart  0-15 Units Subcutaneous TID WC  .  insulin detemir  20 Units Subcutaneous QHS  . isosorbide dinitrate  10 mg Oral TID  . levalbuterol  0.63 mg Nebulization TID  . metoprolol tartrate  25 mg Oral Daily  . niacin  500 mg Oral QHS  . omega-3 acid ethyl esters  1 g Oral BID  . potassium chloride  20 mEq Oral Daily  . sodium chloride  3 mL Intravenous Q12H  . DISCONTD: colchicine  0.6 mg Oral BID

## 2011-11-23 ENCOUNTER — Encounter (HOSPITAL_COMMUNITY)
Admission: EM | Disposition: A | Discharge status: Home / Self Care | Payer: Self-pay | Source: Home / Self Care | Attending: Internal Medicine

## 2011-11-23 HISTORY — PX: LEFT HEART CATHETERIZATION WITH CORONARY ANGIOGRAM: SHX5451

## 2011-11-23 LAB — CBC
HCT: 42.5 % (ref 39.0–52.0)
Hemoglobin: 14 g/dL (ref 13.0–17.0)
MCH: 31 pg (ref 26.0–34.0)
RBC: 4.52 MIL/uL (ref 4.22–5.81)

## 2011-11-23 LAB — GLUCOSE, CAPILLARY: Glucose-Capillary: 82 mg/dL (ref 70–99)

## 2011-11-23 LAB — PROTIME-INR
INR: 1.65 — ABNORMAL HIGH (ref 0.00–1.49)
Prothrombin Time: 20.9 seconds — ABNORMAL HIGH (ref 11.6–15.2)

## 2011-11-23 LAB — BASIC METABOLIC PANEL
BUN: 43 mg/dL — ABNORMAL HIGH (ref 6–23)
Creatinine, Ser: 1.43 mg/dL — ABNORMAL HIGH (ref 0.50–1.35)
GFR calc Af Amer: 50 mL/min — ABNORMAL LOW (ref >90)
GFR calc non Af Amer: 43 mL/min — ABNORMAL LOW (ref >90)
Glucose, Bld: 88 mg/dL (ref 70–99)
Potassium: 3.7 mEq/L (ref 3.5–5.1)

## 2011-11-23 LAB — HEPARIN LEVEL (UNFRACTIONATED): Heparin Unfractionated: 0.48 IU/mL (ref 0.30–0.70)

## 2011-11-23 SURGERY — LEFT HEART CATHETERIZATION WITH CORONARY ANGIOGRAM
Anesthesia: LOCAL

## 2011-11-23 MED ORDER — FUROSEMIDE 80 MG PO TABS
80.0000 mg | ORAL_TABLET | Freq: Every day | ORAL | Status: DC
  Administered 2011-11-24 – 2011-11-25 (×2): 80 mg via ORAL
  Filled 2011-11-23 (×2): qty 1

## 2011-11-23 MED ORDER — WARFARIN - PHARMACIST DOSING INPATIENT: Freq: Every day | Status: DC

## 2011-11-23 MED ORDER — ONDANSETRON HCL 4 MG/2ML IJ SOLN: 4.0000 mg | Freq: Four times a day (QID) | INTRAMUSCULAR | Status: DC | PRN

## 2011-11-23 MED ORDER — ISOSORBIDE DINITRATE 20 MG PO TABS
20.0000 mg | ORAL_TABLET | Freq: Three times a day (TID) | ORAL | Status: DC
  Administered 2011-11-23 – 2011-11-25 (×6): 20 mg via ORAL
  Filled 2011-11-23 (×9): qty 1

## 2011-11-23 MED ORDER — HEPARIN (PORCINE) IN NACL 100-0.45 UNIT/ML-% IJ SOLN
1100.0000 [IU]/h | INTRAMUSCULAR | Status: DC
  Administered 2011-11-23 – 2011-11-24 (×2): 1100 [IU]/h via INTRAVENOUS
  Filled 2011-11-23 (×4): qty 250

## 2011-11-23 MED ORDER — MIDAZOLAM HCL 2 MG/2ML IJ SOLN
INTRAMUSCULAR | Status: AC
  Filled 2011-11-23: qty 2

## 2011-11-23 MED ORDER — RANOLAZINE ER 500 MG PO TB12
500.0000 mg | ORAL_TABLET | Freq: Two times a day (BID) | ORAL | Status: DC
  Administered 2011-11-23 – 2011-11-25 (×4): 500 mg via ORAL
  Filled 2011-11-23 (×5): qty 1

## 2011-11-23 MED ORDER — NITROGLYCERIN 0.2 MG/ML ON CALL CATH LAB
INTRAVENOUS | Status: AC
  Filled 2011-11-23: qty 1

## 2011-11-23 MED ORDER — ACETAMINOPHEN 325 MG PO TABS: 650.0000 mg | ORAL_TABLET | ORAL | Status: DC | PRN

## 2011-11-23 MED ORDER — WARFARIN SODIUM 5 MG PO TABS
5.0000 mg | ORAL_TABLET | Freq: Once | ORAL | Status: AC
  Administered 2011-11-23: 5 mg via ORAL
  Filled 2011-11-23: qty 1

## 2011-11-23 MED ORDER — LIDOCAINE HCL (PF) 1 % IJ SOLN
INTRAMUSCULAR | Status: AC
  Filled 2011-11-23: qty 30

## 2011-11-23 MED ORDER — SODIUM CHLORIDE 0.9 % IV SOLN
INTRAVENOUS | Status: DC
Start: 2011-11-23 — End: 2011-11-25

## 2011-11-23 MED ORDER — HEPARIN (PORCINE) IN NACL 2-0.9 UNIT/ML-% IJ SOLN
INTRAMUSCULAR | Status: AC
  Filled 2011-11-23: qty 2000

## 2011-11-23 MED ORDER — FENTANYL CITRATE 0.05 MG/ML IJ SOLN
INTRAMUSCULAR | Status: AC
  Filled 2011-11-23: qty 2

## 2011-11-23 NOTE — Progress Notes (Signed)
ANTICOAGULATION  CONSULT NOTE- Heparin and Coumadin  Pt to restart heparin and coumadin post cath. Will restart heparin 8 hrs post cath (as ordered by MD) at previous rate of 1100 units/hr with no bolus. Will also restart home dose of Coumadin at 5 mg daily. Will check 8 hr heparin level and AM INR.  Cardell Peach, PharmD

## 2011-11-23 NOTE — CV Procedure (Signed)
Cardiac Catherization  Latricia Heft, 78 y.o., male  Full note dictated; see diagram in chart  DICTATION # [id number], [id number]  Ao 103/52 LV 103/6/10  No significant change since prior cath of 03/06/2010. Medical therapy  Lennette Bihari, MD, [facility] 11/23/2011 3:10 PM

## 2011-11-23 NOTE — Progress Notes (Signed)
Subjective:  No SOB  Objective:  Vital Signs in the last 24 hours: Temp:  [98 F (36.7 C)-98.7 F (37.1 C)] 98.7 F (37.1 C) (04/09 0549) Pulse Rate:  [74-100] 80  (04/09 1009) Resp:  [18-20] 20  (04/09 0549) BP: (117-130)/(60-69) 130/60 mmHg (04/09 1009) SpO2:  [92 %-98 %] 96 % (04/09 0915) Weight:  [78.7 kg (173 lb 8 oz)] 78.7 kg (173 lb 8 oz) (04/09 0549)  Intake/Output from previous day:  Intake/Output Summary (Last 24 hours) at 11/23/11 1136 Last data filed at 11/23/11 0554  Gross per 24 hour  Intake    294 ml  Output    810 ml  Net   -516 ml    Physical Exam: General appearance: alert, cooperative and no distress No JVD Lungs: clear to auscultation bilaterally Heart: regular rate and rhythm 1/6 sem BS+ No significant edema   Rate: 80  Rhythm: normal sinus rhythm  Lab Results:  Basename 11/23/11 0401  WBC 6.6  HGB 14.0  PLT 256    Basename 11/23/11 0401 11/22/11 0545  NA 136 134*  K 3.7 3.7  CL 93* 93*  CO2 31 29  GLUCOSE 88 107*  BUN 43* 44*  CREATININE 1.43* 1.33   No results found for this basename: TROPONINI:2,CK,MB:2 in the last 72 hours Hepatic Function Panel  Basename 11/21/11 0525  PROT 7.1  ALBUMIN 3.1*  AST 21  ALT 11  ALKPHOS 57  BILITOT 0.6  BILIDIR 0.2  IBILI 0.4   No results found for this basename: CHOL in the last 72 hours  Basename 11/23/11 0401  INR 1.77*    Imaging: Imaging results have been reviewed  Cardiac Studies:  Assessment/Plan:   Principal Problem:  *DYSLIPIDEMIA Active Problems:  Acute on chronic systolic congestive heart failure, BNP 6k  Acute bronchitis, treated with nebs and ABX  ICM last EF 40-45% 2D 8/12 after bi-V upgrade, (imrpoved from 20-25%), but now back to 20-25% BY ECHO 4/3/`13  Biventricular ICD (implantable cardiac defibrillator) upgrade, 3/12  Chronic respiratory failure, is on home oxygen  DM  HYPERTENSION  SLEEP APNEA  PE, DVT history  Chronic anticoagulation for PE hx  Bradycardia, original pacer placed 2008  CAD, cabg 1994, multiple PCIs, last cath July 2011, med Rx, was reevaluated for REDO CABG   Thrombocytopenia  S/P renal artery angioplasty, and stent Bilateral, patent by dopplers 08/2011  Gout flare, 11/19/11  Apical mural thrombus   Plan- cath today pending repeat INR at noon. Note increased SCr to 1.43, will hold Lasix This pm and consider decreasing dose.  Corine Shelter PA-C 11/23/2011, 11:36 AM   Patient seen and examined. Agree with assessment and plan. INR repeat pending. Plan for cath once 1.6 or below. Discussed with pt and wife.  Lennette Bihari, MD, [facility] 11/23/2011 12:10 PM

## 2011-11-23 NOTE — Progress Notes (Signed)
ANTICOAGULATION CONSULT NOTE - Follow Up Consult  Pharmacy Consult for heparin Indication: history of PE  Allergies  Allergen Reactions  . Motrin (Ibuprofen) Other (See Comments)    Put hole in stomach    Patient Measurements: Height: 5\' 5"  (165.1 cm) Weight: 173 lb 8 oz (78.7 kg) (b scale) IBW/kg (Calculated) : 61.5  Heparin Dosing Weight: 77.2kg  Vital Signs: Temp: 98.9 F (37.2 C) (04/09 1257) BP: 117/75 mmHg (04/09 1257) Pulse Rate: 76  (04/09 1257)  Labs:  Basename 11/23/11 1204 11/23/11 0401 11/22/11 1840 11/22/11 0545 11/21/11 0525  HGB -- 14.0 -- -- --  HCT -- 42.5 -- -- --  PLT -- 256 -- -- --  APTT -- -- -- -- --  LABPROT 19.8* 20.9* -- 22.8* --  INR 1.65* 1.77* -- 1.97* --  HEPARINUNFRC -- 0.48 0.25* -- --  CREATININE -- 1.43* -- 1.33 1.39*  CKTOTAL -- -- -- -- --  CKMB -- -- -- -- --  TROPONINI -- -- -- -- --   Estimated Creatinine Clearance: 37.2 ml/min (by C-G formula based on Cr of 1.43).   Medications:  Infusions:     . heparin 1,100 Units/hr (11/22/11 1930)    Assessment: 78 yo M on chronic coumadin PTA for history of PE. Started on IV heparin since INR<2 in preparation for cardiac cath. Heparin level is therapeutic on 1100 units/hr.  No bleeding or other problems noted. Plan for cardiac cath when INR <1.6.   Goal of Therapy:  Heparin level 0.3-0.7 units/ml   Plan:  1. Continue heparin at 1100 units/hr 2. F/u with AM heparin level and CBC 3. F/u cath plans  [facility], Pharm.D., BCPS Clinical Pharmacist Pager [phone] 11/23/2011 1:08 PM

## 2011-11-24 LAB — GLUCOSE, CAPILLARY
Glucose-Capillary: 160 mg/dL — ABNORMAL HIGH (ref 70–99)
Glucose-Capillary: 189 mg/dL — ABNORMAL HIGH (ref 70–99)
Glucose-Capillary: 170 mg/dL — ABNORMAL HIGH (ref 70–99)

## 2011-11-24 LAB — BASIC METABOLIC PANEL
CO2: 30 mEq/L (ref 19–32)
Calcium: 9.4 mg/dL (ref 8.4–10.5)
Creatinine, Ser: 1.44 mg/dL — ABNORMAL HIGH (ref 0.50–1.35)

## 2011-11-24 LAB — HEPARIN LEVEL (UNFRACTIONATED): Heparin Unfractionated: 0.31 IU/mL (ref 0.30–0.70)

## 2011-11-24 LAB — CBC
HCT: 39 % (ref 39.0–52.0)
MCHC: 33.1 g/dL (ref 30.0–36.0)
RDW: 13.4 % (ref 11.5–15.5)

## 2011-11-24 LAB — PROTIME-INR
INR: 1.7 — ABNORMAL HIGH (ref 0.00–1.49)
Prothrombin Time: 20.3 seconds — ABNORMAL HIGH (ref 11.6–15.2)

## 2011-11-24 MED ORDER — WARFARIN SODIUM 7.5 MG PO TABS
7.5000 mg | ORAL_TABLET | Freq: Once | ORAL | Status: AC
  Administered 2011-11-24: 7.5 mg via ORAL
  Filled 2011-11-24: qty 1

## 2011-11-24 NOTE — Cardiovascular Report (Signed)
NAME:  Samuel Christensen NO.:  [id number]  MEDICAL RECORD NO.:  [id number]  LOCATION:  4731                         FACILITY:  [facility]  PHYSICIAN:  Samuel Christensen, M.D.     DATE OF BIRTH:  Feb 27, 1925  DATE OF PROCEDURE: DATE OF DISCHARGE:                           CARDIAC CATHETERIZATION   INDICATIONS:  [name] is an 78 year old gentleman who was followed by Dr. Alanda Christensen.  The patient underwent CABG revascularization surgery by Dr. Andrey Christensen in 1992, apparently had 9 grafts done at that time with a LIMA to the LAD, sequential vein graft to his right coronary artery, which was a snake graft going into two marginal vessels, PDA, and PLA.  The vein graft to a diagonal vessel and a vein graft to 3 marginals and distal circumflex.  Subsequently, the patient has been documented on several additional catheterizations to have graft occlusion supplying the diagonal vessel as well as graft occlusion to the graft, which had supplied the sequential circumflex system and also had graft occlusion to the distal limb of the graft, which had gone from the PDA to the PLA.  He also has been documented to have an atretic LIMA.  His last catheterization was done by Dr. Clarene Christensen in 2011. Remotely, he had had stent placements in several grafts.  He also had had a proximal LAD stent.  The patient does have a biventricular pacemaker, but not the fibrillator.  Recently, an echo Doppler study had shown significantly reduced LV function, now at 20-25% as well as evidence for apical thrombus.  The patient had been on chronic Coumadin. In light of his further reduction of LV function, Coumadin has been held and he now presents for cardiac catheterization.  An INR today was 1.65.  PROCEDURE:  After premedication with Versed 1 mg plus fentanyl 25 mcg, the patient was prepped and draped in usual fashion.  Right femoral artery was punctured anteriorly and a 5-French sheath was inserted without  difficulty.  Diagnostic cardiac catheterization was done utilizing 5-French Judkins 4 left and right coronary catheters.  The right catheter was used for selective angiography of the vein graft supplying the right coronary artery system as well as the vein graft, which had supplied the circumflex system.  A left bypass graft catheter was necessary for angiography into the graft, which had supplied the diagonal vessel.  Since the LIMA graft has been document to be occluded for over a decade, injection into the LIMA was not performed.  The patient does have renal insufficiency.  A pigtail catheter was used for LV pressure recording and left ventriculography was not performed due to his renal insufficiency.  Careful attention was made not to have the catheter extend to the apex.  All catheters were then removed from the patient.  The arterial sheath was removed and hemostasis was obtained by manual pressure.  The patient tolerated the procedure well.  Central aortic pressure is 103/52.  Left ventricular pressure 103/60, post A-wave 10.  ANGIOGRAPHIC DATA:  Left main coronary artery was angiographically normal and bifurcated into an LAD and left circumflex system.  The LAD had 40-50% narrowing proximally prior to the stented segment. The stent was widely  patent.  There was a moderate-sized diagonal vessel that arose from the stented segment that had 95% ostial stenosis in this diagonal vessel, which was jailed by the stent.  There was 70% proximal diagonal stenosis.  There also was narrowing in the septal perforating artery ostially of at least 70%, which was jailed by this stent.  The mid LAD had 40% narrowing.  The circumflex vessel immediately gave rise to a prominent atrial circumflex branch.  The circumflex was totally occluded proximally after small first marginal vessel.  This first marginal vessel had 95% stenosis.  The circumflex was then totally occluded, but there was  very faint visualization of the AV groove circumflex via collaterals.  The native right coronary artery was totally occluded proximally.  The sequential vein graft, which supplied four vessels to the right coronary artery involving the RV marginal branch, acute marginal branch, PDA and PLA was widely patent in the first three limbs.  The graft was totally occluded after the PDA takeoff.  The vein graft which had supplied sequentially four vessels arising from the circumflex territory was totally occluded at the origin, this was totally occluded prior to proximally placed stent.  The vein graft, which had supplied the diagonal vessel was totally occluded.  The LIMA graft had been documented to be atretic for well over a decade and consequently selective injection was not done.  Again as noted, pigtail catheter across the aortic valve.  There was no LV-AO gradient.  LVEDP was 6, post A-wave 10.  Left ventriculography was not performed due to the patient's creatinine clearance of 42.  He also has documented apical thrombus.  IMPRESSION: 1. Ischemic cardiomyopathy with echocardiographic determination of     ejection fraction of 20-25%. 2. 40-50% proximal left anterior descending artery stenosis with     widely patent proximal left anterior descending artery stent and     evidence for 95% ostial stenosis and a moderate-sized diagonal     vessel which is jailed by the stent with 70-80% stenosis of the     ostium of the septal perforating artery jailed by the stent with     40% mid left anterior descending artery stenosis. 3. Total occlusion of the proximal circumflex after very small obtuse     marginal 1 vessel with 95% ostial stenosis in the obtuse marginal 1     vessel.  There is faint collateralization of the atrioventricular     groove circumflex. 4. Total proximal right coronary artery occlusion. 5. Sequential vein graft, which previously had supplied four vessels     of the  right coronary artery, which was patent including the right     ventricular marginal branch, acute marginal branch, and posterior     descending artery, but it was totally occluded after the posterior     descending artery takeoff, which was old. 6. Old occlusion of the vein graft, which had supplied four branches     of the circumflex system. 7. Old occlusion of the vein graft, which supplied the diagonal     vessel. 8. Previously documented atretic left internal mammary artery graft,     which had supplied in left anterior descending artery.  RECOMMENDATION:  Increase medical therapy.  The above catheterization findings do not appear to be significantly different from the last catheterization, which was done in July 2011.         ______________________________ Samuel Christensen, M.D.    TK/MEDQ  D:  11/23/2011  T:  11/24/2011  Job:  [id number]  cc:   Richard A. Samuel Christensen, M.D.

## 2011-11-24 NOTE — Progress Notes (Signed)
ANTICOAGULATION CONSULT NOTE - Follow Up Consult  Pharmacy Consult for Heparin/Coumadin Indication: pulmonary embolus (history)  Allergies  Allergen Reactions  . Motrin (Ibuprofen) Other (See Comments)    Put hole in stomach    Patient Measurements: Height: 5\' 5"  (165.1 cm) Weight: 172 lb 6.4 oz (78.2 kg) (c scale) IBW/kg (Calculated) : 61.5  Heparin Dosing Weight: 77.2 kg  Vital Signs: Temp: 98.4 F (36.9 C) (04/10 0800) Temp src: Oral (04/10 0800) BP: 110/54 mmHg (04/10 1009) Pulse Rate: 76  (04/10 1009)  Labs:  Basename 11/24/11 0549 11/23/11 1204 11/23/11 0401 11/22/11 1840 11/22/11 0545  HGB 12.9* -- 14.0 -- --  HCT 39.0 -- 42.5 -- --  PLT 237 -- 256 -- --  APTT -- -- -- -- --  LABPROT 20.3* 19.8* 20.9* -- --  INR 1.70* 1.65* 1.77* -- --  HEPARINUNFRC 0.31 -- 0.48 0.25* --  CREATININE 1.44* -- 1.43* -- 1.33  CKTOTAL -- -- -- -- --  CKMB -- -- -- -- --  TROPONINI -- -- -- -- --   Estimated Creatinine Clearance: 36.8 ml/min (by C-G formula based on Cr of 1.44).   Medications:  Scheduled:    . aspirin EC  81 mg Oral Daily  . atorvastatin  10 mg Oral q1800  . colchicine  0.6 mg Oral Daily  . digoxin  125 mcg Oral Daily  . docusate sodium  100 mg Oral BID  . escitalopram  20 mg Oral Daily  . fenofibrate  160 mg Oral Daily  . fentaNYL      . furosemide  80 mg Oral Daily  . guaiFENesin  600 mg Oral BID  . heparin      . hydrALAZINE  25 mg Oral TID  . insulin aspart  0-15 Units Subcutaneous TID WC  . insulin detemir  20 Units Subcutaneous QHS  . isosorbide dinitrate  20 mg Oral TID  . levalbuterol  0.63 mg Nebulization TID  . lidocaine      . metoprolol tartrate  25 mg Oral Daily  . midazolam      . niacin  500 mg Oral QHS  . nitroGLYCERIN      . omega-3 acid ethyl esters  1 g Oral BID  . potassium chloride  20 mEq Oral Daily  . ranolazine  500 mg Oral BID  . sodium chloride  3 mL Intravenous Q12H  . warfarin  5 mg Oral ONCE-1800  . Warfarin -  Pharmacist Dosing Inpatient   Does not apply q1800  . DISCONTD: furosemide  80 mg Oral BID  . DISCONTD: isosorbide dinitrate  10 mg Oral TID   Infusions:    . sodium chloride    . heparin 1,100 Units/hr (11/24/11 0800)  . DISCONTD: heparin 1,100 Units/hr (11/22/11 1930)    Assessment: 78 yo M on chronic Coumadin PTA for hx of PE.  On IV heparin --> Coumadin bridge s/p cath.  IRN is subtherapeutic as expected.  Heparin level is therapeutic on 1100 units/hr.  No bleeding noted.  Goal of Therapy:  INR 2-3 Heparin level 0.3-0.7 units/ml   Plan:  Continue heparin at 1100 units/hr. Increase Coumadin to 7.5mg  PO x 1 tonight. Follow-up with AM heparin level, CBC, and INR.   [employer], Pharm.D., BCPS Clinical Pharmacist Pager [phone] 11/24/2011 10:41 AM

## 2011-11-24 NOTE — Progress Notes (Signed)
Subjective:  Up in chair, still looks weak  Objective:  Vital Signs in the last 24 hours: Temp:  [97.7 F (36.5 C)-98.9 F (37.2 C)] 98.4 F (36.9 C) (04/10 0800) Pulse Rate:  [68-82] 82  (04/10 0800) Resp:  [16-20] 16  (04/10 0800) BP: (107-130)/(52-75) 114/54 mmHg (04/10 0800) SpO2:  [95 %-99 %] 98 % (04/10 0815) Weight:  [78.2 kg (172 lb 6.4 oz)] 78.2 kg (172 lb 6.4 oz) (04/10 0558)  Intake/Output from previous day:  Intake/Output Summary (Last 24 hours) at 11/24/11 1007 Last data filed at 11/24/11 0800  Gross per 24 hour  Intake   1440 ml  Output   1000 ml  Net    440 ml    Physical Exam: General appearance: alert, cooperative and no distress Lungs: clear to auscultation bilaterally Heart: regular rate and rhythm No LE edema   Rate: 80  Rhythm: paced  Lab Results:  Basename 11/24/11 0549 11/23/11 0401  WBC 6.5 6.6  HGB 12.9* 14.0  PLT 237 256    Basename 11/24/11 0549 11/23/11 0401  NA 136 136  K 3.9 3.7  CL 97 93*  CO2 30 31  GLUCOSE 91 88  BUN 39* 43*  CREATININE 1.44* 1.43*   No results found for this basename: TROPONINI:2,CK,MB:2 in the last 72 hours Hepatic Function Panel No results found for this basename: PROT,ALBUMIN,AST,ALT,ALKPHOS,BILITOT,BILIDIR,IBILI in the last 72 hours No results found for this basename: CHOL in the last 72 hours  Basename 11/24/11 0549  INR 1.70*    Imaging: No results found.  Cardiac Studies:  Assessment/Plan:   Principal Problem:  *Acute on chronic systolic congestive heart failure, BNP 6k Active Problems:  Acute bronchitis, treated with nebs and ABX  ICM last EF 40-45% 2D 8/12 after bi-V upgrade, (imrpoved from 20-25%), but now back to 20-25% BY ECHO 11/17/11  Biventricular ICD (implantable cardiac defibrillator) upgrade, 3/12  CAD, cabg 1994, multiple PCIs, cath July 2011, and 11/23/11 plan is for med Rx,   Chronic respiratory failure, is on home oxygen  DM  DYSLIPIDEMIA  HYPERTENSION  SLEEP APNEA  PE,  DVT history  Chronic anticoagulation for PE hx  Bradycardia, original pacer placed 2008  Thrombocytopenia  S/P renal artery angioplasty, and stent Bilateral, patent by dopplers 08/2011  Gout flare, 11/19/11  Apical mural thrombus   Plan- continue Lasix, check BNP in am, home if INR therapeutic in am.   Corine Shelter PA-C 11/24/2011, 10:07 AM   I have seen and examined the patient along with Corine Shelter PA-C.  I have reviewed the chart, notes and new data.  I agree with PA's note.  Key new complaints: weakness, no dyspnea Key examination changes: weight stable @172 -173 lb (11 lb less than admission) on furosemide 80 mg daily PO. Key new findings / data: Creat and K stable; INR 1.70  PLAN: Keep on current PO meds. DC when INR>2.  Thurmon Fair, MD, [facility] [phone] 11/24/2011, 3:15 PM

## 2011-11-25 LAB — HEPARIN LEVEL (UNFRACTIONATED): Heparin Unfractionated: 0.42 IU/mL (ref 0.30–0.70)

## 2011-11-25 LAB — GLUCOSE, CAPILLARY: Glucose-Capillary: 152 mg/dL — ABNORMAL HIGH (ref 70–99)

## 2011-11-25 LAB — BASIC METABOLIC PANEL
Calcium: 9.8 mg/dL (ref 8.4–10.5)
Chloride: 98 mEq/L (ref 96–112)
Creatinine, Ser: 1.49 mg/dL — ABNORMAL HIGH (ref 0.50–1.35)
GFR calc Af Amer: 48 mL/min — ABNORMAL LOW (ref >90)
GFR calc non Af Amer: 41 mL/min — ABNORMAL LOW (ref >90)

## 2011-11-25 LAB — CBC
MCHC: 32.8 g/dL (ref 30.0–36.0)
RDW: 13.5 % (ref 11.5–15.5)

## 2011-11-25 LAB — PROTIME-INR
INR: 2.1 — ABNORMAL HIGH (ref 0.00–1.49)
Prothrombin Time: 23.9 seconds — ABNORMAL HIGH (ref 11.6–15.2)

## 2011-11-25 MED ORDER — FENOFIBRATE 160 MG PO TABS: 160.0000 mg | ORAL_TABLET | Freq: Every day | ORAL | Status: DC

## 2011-11-25 MED ORDER — FENOFIBRATE 145 MG PO TABS: 145.0000 mg | ORAL_TABLET | Freq: Every day | ORAL | Status: AC

## 2011-11-25 MED ORDER — ASPIRIN 81 MG PO TBEC: 81.0000 mg | DELAYED_RELEASE_TABLET | Freq: Every day | ORAL | Status: DC

## 2011-11-25 MED ORDER — FUROSEMIDE 80 MG PO TABS: 80.0000 mg | ORAL_TABLET | Freq: Every day | ORAL | Status: DC

## 2011-11-25 MED ORDER — RANOLAZINE ER 500 MG PO TB12: 500.0000 mg | ORAL_TABLET | Freq: Two times a day (BID) | ORAL | Status: DC

## 2011-11-25 MED ORDER — ISOSORBIDE DINITRATE 20 MG PO TABS: 20.0000 mg | ORAL_TABLET | Freq: Three times a day (TID) | ORAL | Status: AC

## 2011-11-25 MED ORDER — DSS 100 MG PO CAPS: 100.0000 mg | ORAL_CAPSULE | Freq: Two times a day (BID) | ORAL | Status: AC

## 2011-11-25 MED ORDER — POTASSIUM CHLORIDE CRYS ER 20 MEQ PO TBCR: 20.0000 meq | EXTENDED_RELEASE_TABLET | Freq: Every day | ORAL | Status: AC

## 2011-11-25 MED ORDER — COLCHICINE 0.6 MG PO TABS: 0.6000 mg | ORAL_TABLET | Freq: Every day | ORAL | Status: AC

## 2011-11-25 MED ORDER — WARFARIN SODIUM 5 MG PO TABS
5.0000 mg | ORAL_TABLET | Freq: Every day | ORAL | Status: DC
  Filled 2011-11-25: qty 1

## 2011-11-25 NOTE — Progress Notes (Signed)
Pt. Seen and examined. Agree with the NP/PA-C note as written.  Markedly improved. Cath showed no changes. INR is now therapeutic again. Plan to ambulate today. Excellent family support at home. Discharge today with office follow-up in 1 week with Dr. Alanda Amass.  Chrystie Nose, MD, [facility] Attending Cardiologist [facility]

## 2011-11-25 NOTE — Progress Notes (Signed)
Chart Review Complete. Patient is clinically appropriate for services, but has declined. Spoke with son Tammy Sours via phone and he also declined a transition of care call.  Will notify [facility] RN that we are a available resource if needed at home health discharge. For any additional questions or new referrals please contact Anibal Henderson BSN RN [facility] Liaison at [phone].

## 2011-11-25 NOTE — Discharge Summary (Signed)
Juelz Claar C. Haylea Schlichting, MD, FACC Attending Cardiologist [facility]  

## 2011-11-25 NOTE — Progress Notes (Signed)
Pt discharged per w/c with all belongings. Accompanied by RN, wife and son. Pt and family aware of al follow up visits including Coumadin lab work , home meds, prescriptions, heart failure teaching including daily weights and 2 gm Na diet.  T Lashona Schaaf RN

## 2011-11-25 NOTE — Discharge Summary (Signed)
Physician Discharge Summary  Patient ID: Samuel Christensen MRN: [id number] DOB/AGE: 1924-09-11 78 y.o.  Admit date: 11/15/2011 Discharge date: 11/25/2011  Admission Diagnoses: Acute on chronic systolic HF/Acute bronchitis.  Discharge Diagnoses:  Principal Problem:  *Acute on chronic systolic congestive heart failure, BNP 6k Active Problems:  DM  DYSLIPIDEMIA  HYPERTENSION  SLEEP APNEA  ICM last EF 40-45% 2D 8/12 after bi-V upgrade, (imrpoved from 20-25%), but now back to 20-25% BY ECHO 11/17/11  Biventricular ICD (implantable cardiac defibrillator) upgrade, 3/12  PE, DVT history  Chronic anticoagulation for PE hx  Bradycardia, original pacer placed 2008  CAD, cabg 1994, multiple PCIs, cath July 2011, and 11/23/11 plan is for med Rx,   Thrombocytopenia  S/P renal artery angioplasty, and stent Bilateral, patent by dopplers 08/2011  Acute bronchitis, treated with nebs and ABX  Chronic respiratory failure, is on home oxygen  Apical mural thrombus  Gout flare, 11/19/11  Discharged Condition: stable  Hospital Course:   The patient is an 78 year old Caucasian male who is accompanied by his wife, who gives a large amount of the history. His history is significant significant for coronary artery disease status post remote coronary bypass grafting. His last heart catheterization was in July 2011 which showed a patent sequential RCA graft to the PDA and PLA and a thrombus and TIMI I to 2 flow which was treated medically. He has an occluded SVG to the circumflex and occluded SVG to diagonal. Patient also has a by the pacemaker upgraded in March of 2012. Patient said subsequent improvement in ejection fraction from 25-35% up to now 40-45%. Patient also has a history of obstructive sleep apnea COPD, ischemic cardiomyopathy, pulmonary embolism for which he takes Coumadin, chronic venous insufficiency, hypertension, dyslipidemia, diabetes mellitus. The patient presents with 2 weeks of shortness of breath which  is getting progressively worse. He has two-pillow orthopnea, dizziness, PND, lower extremity edema, chest pain for 2-3 times per day she appears worse with exertion cough, congestion. Patient apparently is coughing up green "chunks". The patient's had some nausea and vomiting on Thursday and Friday last week. He's also had decreased appetite for that time period and has been very thirsty and drinking water all day long. He denies any fever, abdominal pain, dysuria, hematuria, hematochezia, melena.  BNP was 5999 on admission.  He was started on IV diuretic, guaifenesin, Xopenex, azithromycin treatments.  Coumadin continued and he had a slightly subtherapeutic INR.  2D echo showed EF 20-25%, grade two diastolic HF, elevated pulmonary pressure and LV apical thrombus.(see full report below).  Coumadin was held for left heart cath procedure required do to decreased EF.   Pt diuresed well during stay with improvement in symptoms.  Diuretic was changed to PO.  Heart cath showed no significant change from prior cath 03/06/2010.  Coumadin was restarted.  Patient ambulated 22ft with walker the AM of discharge and tolerated it well.  INR is therapeutic.  He has been seen by Dr. Rennis Golden and is stable from DC home.  F/U next Thursday.  D/c Weight 173#.  Consults: None  Significant Diagnostic Studies:  Study Conclusions  - Left ventricle: Wall thickness was increased in a pattern of mild LVH. Systolic function was severely reduced. The estimated ejection fraction was in the range of 20% to 25%. Akinesis of the entireinferolateral and inferior myocardium; consistent with infarction. Moderate hypokinesis of the anterior, anterolateral, and apical myocardium. Features are consistent with a pseudonormal left ventricular filling pattern, with concomitant abnormal relaxation and increased filling pressure (  grade 2 diastolic dysfunction). Doppler parameters are consistent with elevated mean left atrial filling pressure.  There was a medium-sized, 25mm (L) x 15mm (W), protruding, solid, fixedthrombus. - Ventricular septum: Septal motion showed paradox. These changes are consistent with right ventricular pacing. - Aortic valve: Trivial regurgitation. - Mitral valve: Calcified annulus. Mild regurgitation. - Left atrium: The atrium was moderately to severely dilated. - Right ventricle: Systolic function was moderately reduced. - Atrial septum: No defect or patent foramen ovale was identified. - Pulmonary arteries: Systolic pressure was moderately increased. PA peak pressure: 53mm Hg (S).     Treatments: IV diuretics, Azithromycin, Heart Cath, Echo  Discharge Exam: Blood pressure 122/60, pulse 78, temperature 98.1 F (36.7 C), temperature source Oral, resp. rate 18, height 5\' 5"  (1.651 m), weight 78.563 kg (173 lb 3.2 oz), SpO2 98.00%.   Disposition: 01-Home or Self Care  Discharge Orders    Future Orders Please Complete By Expires   Diet - low sodium heart healthy      Increase activity slowly      Discharge instructions      Comments:   Daily weights. Low sodium diet. Call our office if you gain 1-2 pounds in at day or three to four in a week.     Medication List  As of 11/25/2011  1:56 PM   STOP taking these medications         ezetimibe-simvastatin 10-10 MG per tablet      nebivolol 5 MG tablet      potassium chloride 10 MEQ CR capsule         TAKE these medications         aspirin 81 MG EC tablet   Take 1 tablet (81 mg total) by mouth daily.      CENTRUM SILVER PO   Take by mouth daily.      colchicine 0.6 MG tablet   Take 1 tablet (0.6 mg total) by mouth daily.      digoxin 0.125 MG tablet   Commonly known as: LANOXIN   Take 125 mcg by mouth daily.      DSS 100 MG Caps   Take 100 mg by mouth 2 (two) times daily.      famotidine 20 MG tablet   Commonly known as: PEPCID   Take 20 mg by mouth 2 (two) times daily as needed. For reflux.      fenofibrate 145 MG tablet     Commonly known as: TRICOR   Take 1 tablet (145 mg total) by mouth daily.      Fish Oil 1000 MG Caps   Take 1,000 mg by mouth daily. 2 po daily      furosemide 80 MG tablet   Commonly known as: LASIX   Take 1 tablet (80 mg total) by mouth daily.      hydrALAZINE 25 MG tablet   Commonly known as: APRESOLINE   Take 25 mg by mouth 3 (three) times daily. 1 1/2 every 8 hours      insulin detemir 100 UNIT/ML injection   Commonly known as: LEVEMIR   Inject 20 Units into the skin at bedtime.      isosorbide dinitrate 20 MG tablet   Commonly known as: ISORDIL   Take 1 tablet (20 mg total) by mouth 3 (three) times daily.      LEXAPRO 20 MG tablet   Generic drug: escitalopram   Take 20 mg by mouth daily.      metoprolol tartrate  25 MG tablet   Commonly known as: LOPRESSOR   Take 25 mg by mouth daily.      niacin 500 MG tablet   Take 500 mg by mouth at bedtime. 2 po at bedtime      potassium chloride SA 20 MEQ tablet   Commonly known as: K-DUR,KLOR-CON   Take 1 tablet (20 mEq total) by mouth daily.      ranolazine 500 MG 12 hr tablet   Commonly known as: RANEXA   Take 1 tablet (500 mg total) by mouth 2 (two) times daily.      rosuvastatin 5 MG tablet   Commonly known as: CRESTOR   Take 5 mg by mouth daily.      warfarin 5 MG tablet   Commonly known as: COUMADIN   Take 5 mg by mouth daily.           Follow-up Information    Follow up with [facility] R, NP on 12/02/2011. (              2:00 PM.)    Contact information:   [address] [place] [address] [phone]          Signed: Dwana Melena 11/25/2011, 1:56 PM

## 2011-11-25 NOTE — Progress Notes (Signed)
[facility]  Subjective: No CP.  SOB stable on O2  Objective: Vital signs in last 24 hours: Temp:  [98 F (36.7 C)-98.7 F (37.1 C)] 98.1 F (36.7 C) (04/11 0433) Pulse Rate:  [74-87] 77  (04/11 0433) Resp:  [16-18] 18  (04/11 0433) BP: (109-130)/(54-63) 127/63 mmHg (04/11 0433) SpO2:  [92 %-100 %] 98 % (04/11 0805) Weight:  [78.563 kg (173 lb 3.2 oz)] 78.563 kg (173 lb 3.2 oz) (04/11 0433) Last BM Date: 11/24/11  Intake/Output from previous day: 04/10 0701 - 04/11 0700 In: 832 [P.O.:700; I.V.:132] Out: 1502 [Urine:1500; Stool:2] Intake/Output this shift:    Medications Current Facility-Administered Medications  Medication Dose Route Frequency Provider Last Rate Last Dose  . 0.9 %  sodium chloride infusion  250 mL Intravenous PRN Dwana Melena, PA      . 0.9 %  sodium chloride infusion   Intravenous Continuous Lennette Bihari, MD      . acetaminophen (TYLENOL) tablet 650 mg  650 mg Oral Q4H PRN Lennette Bihari, MD      . ALPRAZolam Prudy Feeler) tablet 0.25 mg  0.25 mg Oral TID PRN Abelino Derrick, PA   0.25 mg at 11/25/11 0000  . aspirin EC tablet 81 mg  81 mg Oral Daily Dwana Melena, PA   81 mg at 11/24/11 1017  . atorvastatin (LIPITOR) tablet 10 mg  10 mg Oral q1800 Dwana Melena, PA   10 mg at 11/24/11 1728  . colchicine tablet 0.6 mg  0.6 mg Oral Daily [name], NP   0.6 mg at 11/24/11 1017  . digoxin (LANOXIN) tablet 125 mcg  125 mcg Oral Daily Dwana Melena, PA   125 mcg at 11/24/11 1017  . docusate sodium (COLACE) capsule 100 mg  100 mg Oral BID [name], NP   100 mg at 11/24/11 2105  . escitalopram (LEXAPRO) tablet 20 mg  20 mg Oral Daily Dwana Melena, PA   20 mg at 11/24/11 1017  . famotidine (PEPCID) tablet 20 mg  20 mg Oral BID PRN Dwana Melena, PA      . fenofibrate tablet 160 mg  160 mg Oral Daily Dwana Melena, PA   160 mg at 11/24/11 1017  . furosemide (LASIX) tablet 80 mg  80 mg Oral Daily Eda Paschal [place], [place]   80 mg at 11/24/11  1033  . guaiFENesin (MUCINEX) 12 hr tablet 600 mg  600 mg Oral BID Dwana Melena, PA   600 mg at 11/24/11 2105  . heparin ADULT infusion 100 units/mL (25000 units/250 mL)  1,100 Units/hr Intravenous Continuous Chrystie Nose, MD 11 mL/hr at 11/24/11 2105 1,100 Units/hr at 11/24/11 2105  . hydrALAZINE (APRESOLINE) tablet 25 mg  25 mg Oral TID Dwana Melena, PA   25 mg at 11/24/11 2105  . insulin aspart (novoLOG) injection 0-15 Units  0-15 Units Subcutaneous TID WC Dwana Melena, PA   3 Units at 11/24/11 1732  . insulin detemir (LEVEMIR) injection 20 Units  20 Units Subcutaneous QHS Dwana Melena, PA   20 Units at 11/24/11 2217  . isosorbide dinitrate (ISORDIL) tablet 20 mg  20 mg Oral TID Lennette Bihari, MD   20 mg at 11/24/11 2105  . lactulose (CHRONULAC) 10 GM/15ML solution 20 g  20 g Oral Daily PRN [name], NP   20 g at 11/20/11 1610  . levalbuterol (XOPENEX) nebulizer solution 0.63 mg  0.63  mg Nebulization TID [name], NP   0.63 mg at 11/25/11 0803  . metoprolol tartrate (LOPRESSOR) tablet 25 mg  25 mg Oral Daily Dwana Melena, PA   25 mg at 11/24/11 1017  . niacin tablet 500 mg  500 mg Oral QHS Dwana Melena, PA   500 mg at 11/24/11 2105  . omega-3 acid ethyl esters (LOVAZA) capsule 1 g  1 g Oral BID Dwana Melena, PA   1 g at 11/24/11 2217  . ondansetron (ZOFRAN) injection 4 mg  4 mg Intravenous Q6H PRN Lennette Bihari, MD      . potassium chloride SA (K-DUR,KLOR-CON) CR tablet 20 mEq  20 mEq Oral Daily Mihai Croitoru, MD   20 mEq at 11/24/11 1017  . ranolazine (RANEXA) 12 hr tablet 500 mg  500 mg Oral BID Lennette Bihari, MD   500 mg at 11/24/11 2105  . sodium chloride 0.9 % injection 3 mL  3 mL Intravenous Q12H Dwana Melena, PA   3 mL at 11/22/11 2240  . sodium chloride 0.9 % injection 3 mL  3 mL Intravenous PRN Dwana Melena, PA   3 mL at 11/21/11 1015  . warfarin (COUMADIN) tablet 7.5 mg  7.5 mg Oral ONCE-1800 Kimberly Ballard Hammons, PHARMD   7.5 mg at 11/24/11 1728  .  Warfarin - Pharmacist Dosing Inpatient   Does not apply q1800 Chrystie Nose, MD      . zolpidem (AMBIEN) tablet 5 mg  5 mg Oral QHS PRN Abelino Derrick, PA        PE: General appearance: alert, cooperative and no distress Lungs: clear to auscultation bilaterally Heart: regular rate and rhythm, S1, S2 normal, no murmur, click, rub or gallop Extremities: No LEE Pulses: Radials 2+ and symmetric. Right DP 2+.  Decreased left DP, foot warm.  Lab Results:   Basename 11/25/11 0611 11/24/11 0549 11/23/11 0401  WBC 5.7 6.5 6.6  HGB 13.3 12.9* 14.0  HCT 40.5 39.0 42.5  PLT 230 237 256   BMET  Basename 11/25/11 0611 11/24/11 0549 11/23/11 0401  NA 137 136 136  K 4.3 3.9 3.7  CL 98 97 93*  CO2 32 30 31  GLUCOSE 97 91 88  BUN 34* 39* 43*  CREATININE 1.49* 1.44* 1.43*  CALCIUM 9.8 9.4 9.5   PT/INR  Basename 11/25/11 0611 11/24/11 0549 11/23/11 1204  LABPROT 23.9* 20.3* 19.8*  INR 2.10* 1.70* 1.65*    Assessment/Plan  Principal Problem:  *Acute on chronic systolic congestive heart failure, BNP 6k Active Problems:  DM  DYSLIPIDEMIA  HYPERTENSION  SLEEP APNEA  ICM last EF 40-45% 2D 8/12 after bi-V upgrade, (imrpoved from 20-25%), but now back to 20-25% BY ECHO 11/17/11  Biventricular ICD (implantable cardiac defibrillator) upgrade, 3/12  PE, DVT history  Chronic anticoagulation for PE hx  Bradycardia, original pacer placed 2008  CAD, cabg 1994, multiple PCIs, cath July 2011, and 11/23/11 plan is for med Rx,   Thrombocytopenia  S/P renal artery angioplasty, and stent Bilateral, patent by dopplers 08/2011  Acute bronchitis, treated with nebs and ABX  Chronic respiratory failure, is on home oxygen  Apical mural thrombus  Gout flare, 11/19/11  Plan:  INR therapeutic at 2.10.  BNP now 2980.0 from 6000.  SCr stable at 1.49. WT stable and decreased 7# from admission. Net fluid: -167 yesterday.   OP BMET prior to office visit which will be within 7 days.  Daily wts and low sodium  diet  has been discussed.  Ambulate and home today.   LOS: 10 days    Chastelyn [place] W 11/25/2011 9:05 AM

## 2011-11-25 NOTE — Progress Notes (Signed)
Pt ambulated in hallway with 02 and front wheeled walker 200 ft Tolerated well. Marisa Cyphers RN

## 2011-11-25 NOTE — Progress Notes (Signed)
ANTICOAGULATION CONSULT NOTE - Follow Up Consult  Pharmacy Consult for Heparin/Coumadin Indication: pulmonary embolus (history)  Allergies  Allergen Reactions  . Motrin (Ibuprofen) Other (See Comments)    Put hole in stomach    Patient Measurements: Height: 5\' 5"  (165.1 cm) Weight: 173 lb 3.2 oz (78.563 kg) (c scale) IBW/kg (Calculated) : 61.5  Heparin Dosing Weight: 77.2 kg  Vital Signs: Temp: 98.1 F (36.7 C) (04/11 0433) Temp src: Oral (04/11 0433) BP: 122/60 mmHg (04/11 1100) Pulse Rate: 78  (04/11 1100)  Labs:  Basename 11/25/11 0611 11/24/11 0549 11/23/11 1204 11/23/11 0401  HGB 13.3 12.9* -- --  HCT 40.5 39.0 -- 42.5  PLT 230 237 -- 256  APTT -- -- -- --  LABPROT 23.9* 20.3* 19.8* --  INR 2.10* 1.70* 1.65* --  HEPARINUNFRC 0.42 0.31 -- 0.48  CREATININE 1.49* 1.44* -- 1.43*  CKTOTAL -- -- -- --  CKMB -- -- -- --  TROPONINI -- -- -- --   Estimated Creatinine Clearance: 35.7 ml/min (by C-G formula based on Cr of 1.49).   Medications:  Scheduled:     . aspirin EC  81 mg Oral Daily  . atorvastatin  10 mg Oral q1800  . colchicine  0.6 mg Oral Daily  . digoxin  125 mcg Oral Daily  . docusate sodium  100 mg Oral BID  . escitalopram  20 mg Oral Daily  . fenofibrate  160 mg Oral Daily  . furosemide  80 mg Oral Daily  . guaiFENesin  600 mg Oral BID  . hydrALAZINE  25 mg Oral TID  . insulin aspart  0-15 Units Subcutaneous TID WC  . insulin detemir  20 Units Subcutaneous QHS  . isosorbide dinitrate  20 mg Oral TID  . levalbuterol  0.63 mg Nebulization TID  . metoprolol tartrate  25 mg Oral Daily  . niacin  500 mg Oral QHS  . omega-3 acid ethyl esters  1 g Oral BID  . potassium chloride  20 mEq Oral Daily  . ranolazine  500 mg Oral BID  . sodium chloride  3 mL Intravenous Q12H  . warfarin  7.5 mg Oral ONCE-1800  . Warfarin - Pharmacist Dosing Inpatient   Does not apply q1800   Infusions:     . sodium chloride    . heparin 1,100 Units/hr (11/24/11  2105)    Assessment: 78 yo M on chronic Coumadin PTA for hx of PE.  On IV heparin --> Coumadin bridge s/p cath.  IRN is therapeutic today.  Will d/c heparin infusion.  Per MD notes, plan for discharge today.  Can resumen outpatient Coumadin regimen at discharge.    Goal of Therapy:  INR 2-3 Heparin level 0.3-0.7 units/ml   Plan:  D/C heparin. D/C heparin labs. Coumadin 5 mg PO daily at discharge.  [employer], Pharm.D., BCPS Clinical Pharmacist Pager [phone] 11/25/2011 12:24 PM

## 2012-03-31 ENCOUNTER — Encounter (HOSPITAL_COMMUNITY): Payer: Self-pay | Admitting: *Deleted

## 2012-03-31 ENCOUNTER — Emergency Department (HOSPITAL_COMMUNITY)
Admission: EM | Admit: 2012-03-31 | Discharge: 2012-03-31 | Discharge status: Against Medical Advice | Payer: Medicare Other | Attending: Emergency Medicine | Admitting: Emergency Medicine

## 2012-03-31 DIAGNOSIS — R42 Dizziness and giddiness: Secondary | ICD-10-CM | POA: Insufficient documentation

## 2012-03-31 DIAGNOSIS — R29898 Other symptoms and signs involving the musculoskeletal system: Secondary | ICD-10-CM | POA: Insufficient documentation

## 2012-03-31 DIAGNOSIS — R0602 Shortness of breath: Secondary | ICD-10-CM | POA: Insufficient documentation

## 2012-03-31 NOTE — ED Notes (Signed)
Dr.Sheldon to eval ekg.

## 2012-03-31 NOTE — ED Notes (Signed)
Pt reports 2 week hx of leg weakness, dizziness, and mild sob. Denies chest pain. Pt was seen by PCP this am and sent to ED for further eval. Stroke scale negative.

## 2012-04-03 ENCOUNTER — Encounter (HOSPITAL_COMMUNITY): Payer: Self-pay | Admitting: *Deleted

## 2012-04-03 ENCOUNTER — Emergency Department (HOSPITAL_COMMUNITY): Payer: Medicare Other

## 2012-04-03 ENCOUNTER — Emergency Department (HOSPITAL_COMMUNITY)
Admission: EM | Admit: 2012-04-03 | Discharge: 2012-04-03 | Disposition: A | Discharge status: Home / Self Care | Payer: Medicare Other | Attending: Emergency Medicine | Admitting: Emergency Medicine

## 2012-04-03 DIAGNOSIS — R21 Rash and other nonspecific skin eruption: Secondary | ICD-10-CM | POA: Insufficient documentation

## 2012-04-03 DIAGNOSIS — R42 Dizziness and giddiness: Secondary | ICD-10-CM | POA: Insufficient documentation

## 2012-04-03 DIAGNOSIS — R5383 Other fatigue: Secondary | ICD-10-CM | POA: Insufficient documentation

## 2012-04-03 DIAGNOSIS — T1490XA Injury, unspecified, initial encounter: Secondary | ICD-10-CM | POA: Insufficient documentation

## 2012-04-03 DIAGNOSIS — R5381 Other malaise: Secondary | ICD-10-CM | POA: Insufficient documentation

## 2012-04-03 DIAGNOSIS — I6789 Other cerebrovascular disease: Secondary | ICD-10-CM | POA: Insufficient documentation

## 2012-04-03 DIAGNOSIS — R531 Weakness: Secondary | ICD-10-CM

## 2012-04-03 DIAGNOSIS — E119 Type 2 diabetes mellitus without complications: Secondary | ICD-10-CM | POA: Insufficient documentation

## 2012-04-03 DIAGNOSIS — Z7901 Long term (current) use of anticoagulants: Secondary | ICD-10-CM | POA: Insufficient documentation

## 2012-04-03 DIAGNOSIS — I10 Essential (primary) hypertension: Secondary | ICD-10-CM | POA: Insufficient documentation

## 2012-04-03 DIAGNOSIS — W19XXXA Unspecified fall, initial encounter: Secondary | ICD-10-CM | POA: Insufficient documentation

## 2012-04-03 DIAGNOSIS — Z794 Long term (current) use of insulin: Secondary | ICD-10-CM | POA: Insufficient documentation

## 2012-04-03 DIAGNOSIS — I25810 Atherosclerosis of coronary artery bypass graft(s) without angina pectoris: Secondary | ICD-10-CM | POA: Insufficient documentation

## 2012-04-03 DIAGNOSIS — Z95810 Presence of automatic (implantable) cardiac defibrillator: Secondary | ICD-10-CM | POA: Insufficient documentation

## 2012-04-03 LAB — URINALYSIS, ROUTINE W REFLEX MICROSCOPIC
Glucose, UA: NEGATIVE mg/dL
Hgb urine dipstick: NEGATIVE
Ketones, ur: NEGATIVE mg/dL
Protein, ur: 30 mg/dL — AB
pH: 7 (ref 5.0–8.0)

## 2012-04-03 LAB — COMPREHENSIVE METABOLIC PANEL
ALT: 32 U/L (ref 0–53)
Alkaline Phosphatase: 39 U/L (ref 39–117)
CO2: 30 mEq/L (ref 19–32)
Calcium: 10.1 mg/dL (ref 8.4–10.5)
Chloride: 96 mEq/L (ref 96–112)
GFR calc Af Amer: 39 mL/min — ABNORMAL LOW (ref >90)
GFR calc non Af Amer: 34 mL/min — ABNORMAL LOW (ref >90)
Glucose, Bld: 169 mg/dL — ABNORMAL HIGH (ref 70–99)
Sodium: 136 mEq/L (ref 135–145)
Total Bilirubin: 0.5 mg/dL (ref 0.3–1.2)

## 2012-04-03 LAB — URINE MICROSCOPIC-ADD ON

## 2012-04-03 LAB — CBC WITH DIFFERENTIAL/PLATELET
Eosinophils Relative: 2 % (ref 0–5)
HCT: 39.4 % (ref 39.0–52.0)
Lymphocytes Relative: 28 % (ref 12–46)
Lymphs Abs: 1.7 10*3/uL (ref 0.7–4.0)
MCV: 99.2 fL (ref 78.0–100.0)
Neutro Abs: 3.8 10*3/uL (ref 1.7–7.7)
Platelets: 182 10*3/uL (ref 150–400)
RBC: 3.97 MIL/uL — ABNORMAL LOW (ref 4.22–5.81)
WBC: 6.1 10*3/uL (ref 4.0–10.5)

## 2012-04-03 MED ORDER — SODIUM CHLORIDE 0.9 % IV SOLN: INTRAVENOUS | Status: DC

## 2012-04-03 MED ORDER — SODIUM CHLORIDE 0.9 % IV BOLUS (SEPSIS): 500.0000 mL | Freq: Once | INTRAVENOUS | Status: DC

## 2012-04-03 NOTE — ED Notes (Signed)
Patient states falling episodes recently, patient states unsure of whether he is getting dizzy or losing balance and falling when episodes occur.  Patient a/ox 4 at this time.

## 2012-04-03 NOTE — ED Notes (Signed)
Pt was sent here from his MD office and was seen on Friday.  Pt has been having frequent falls.  Pt was on coumadin but has been off for the last  8 days.  Pt is here with dizziness, falls, unsteady gait, and slower than normal.  Pt is alert and talking and sent her for a scan of his head

## 2012-04-03 NOTE — ED Provider Notes (Signed)
History     CSN: [id number]  Arrival date & time 04/03/12  0908   First MD Initiated Contact with Patient 04/03/12 [phone]      Chief Complaint  Patient presents with  . Weakness  . Fall    (Consider location/radiation/quality/duration/timing/severity/associated sxs/prior treatment) The history is provided by the patient and the spouse.   patient is an 78 year old male followed by Dr. Felipa Eth, was to be seen on Friday but they left the ED before workup in admission could be arranged. He was referred in from the office on Friday to be admitted patient's had frequent falls at home over the weekend has not had any he also was on Coumadin up to 8 days ago. He has generalized weakness history of several falls unsteady gait but not over the weekend also complains of some dizziness. No complaints of pain denies chest pain headache shortness of breath abdominal pain back pain neck pain extremity pain.  Past Medical History  Diagnosis Date  . Renal anomaly     renal insufficiency, concerened about contrast nephropathy   . Morbid obesity   . Sleep apnea   . CAD (coronary artery disease)     prior bypass surgery  . CHF (congestive heart failure)     chroinc, systolic  . Heart block     complete heart block s/p pacemaker implantation  . Pulmonary embolism     recurrent  . HTN (hypertension)   . Dyslipidemia   . Chronic kidney disease   . Peripheral vascular disease   . Angina   . Myocardial infarction 06/2010  . DVT of lower extremity, bilateral   . Pacemaker   . ICD (implantable cardiac defibrillator) in place   . Shortness of breath     "@ any time"  . Pneumonia   . Type II diabetes mellitus   . GERD (gastroesophageal reflux disease)   . Arthritis   . Gout   . Depression   . Thrombocytopenia 11/17/2011  . S/P renal artery angioplasty, and stent Bilateral, patent by dopplers 08/2011 11/18/2011  . Acute bronchitis, treated with nebs and ABX 11/21/2011  . Chronic respiratory failure, is on  home oxygen 11/21/2011  . Apical mural thrombus 11/21/2011  . Gout flare, 11/19/11 11/21/2011    Past Surgical History  Procedure Date  . Coronary artery bypass graft     x9 in 1991, we are still trying to obtain the repot of this operation.   . Lead revision 08/2007  . Cardiac defibrillator placement 10/2010  . Insert / replace / remove pacemaker 07/2007    [facility]when he had bradycardic arrest requiring intubation secondary to complete heart block,  . Insert / replace / remove pacemaker 10/2010    explant/implant  . Shoulder open rotator cuff repair 04/2001    right  . Cholecystectomy   . Joint replacement   . Replacement total knee bilateral   . Cataract extraction w/ intraocular lens  implant, bilateral   . Eye surgery     revision left cataract; "had lens implanted again"  . Coronary angioplasty with stent placement     "total of 11" (11/15/11)  . Renal stents     "total of 2" (11/15/11)    Family History  Problem Relation Age of Onset  . Heart failure Neg Hx     History  Substance Use Topics  . Smoking status: Never Smoker   . Smokeless tobacco: Never Used  . Alcohol Use: No  Review of Systems  Constitutional: Negative for fever and chills.  HENT: Negative for congestion and neck pain.   Eyes: Negative for visual disturbance.  Respiratory: Negative for shortness of breath.   Cardiovascular: Negative for chest pain.  Gastrointestinal: Negative for nausea, vomiting and abdominal pain.  Genitourinary: Negative for dysuria, hematuria and flank pain.  Musculoskeletal: Negative for back pain.  Skin: Negative for rash.  Neurological: Positive for dizziness. Negative for headaches.  Hematological: Bruises/bleeds easily.    Allergies  Motrin  Home Medications   Current Outpatient Rx  Name Route Sig Dispense Refill  . COLCHICINE 0.6 MG PO TABS Oral Take 1 tablet (0.6 mg total) by mouth daily. 30 tablet 2  . DIGOXIN 0.125 MG PO TABS Oral Take 125 mcg  by mouth daily.      Marland Kitchen ESCITALOPRAM OXALATE 20 MG PO TABS Oral Take 20 mg by mouth daily.      . FENOFIBRATE 145 MG PO TABS Oral Take 1 tablet (145 mg total) by mouth daily. 30 tablet 11  . FUROSEMIDE 40 MG PO TABS Oral Take 60 mg by mouth daily.    Marland Kitchen HYDRALAZINE HCL 25 MG PO TABS Oral Take 25 mg by mouth 3 (three) times daily.     . INSULIN DETEMIR 100 UNIT/ML [place] SOLN Subcutaneous Inject 20 Units into the skin at bedtime.    . ISOSORBIDE DINITRATE 20 MG PO TABS Oral Take 1 tablet (20 mg total) by mouth 3 (three) times daily. 90 tablet 11  . METOPROLOL SUCCINATE ER 25 MG PO TB24 Oral Take 25 mg by mouth daily.    . CENTRUM SILVER PO Oral Take 1 tablet by mouth daily.     Marland Kitchen NIACIN 500 MG PO TABS Oral Take 1,000 mg by mouth at bedtime.     Marland Kitchen FISH OIL 1000 MG PO CAPS Oral Take 2,000 mg by mouth daily.     Marland Kitchen POTASSIUM CHLORIDE CRYS ER 20 MEQ PO TBCR Oral Take 1 tablet (20 mEq total) by mouth daily. 30 tablet 11  . RANOLAZINE ER 500 MG PO TB12 Oral Take 1 tablet (500 mg total) by mouth 2 (two) times daily. 60 tablet 11  . ROSUVASTATIN CALCIUM 10 MG PO TABS Oral Take 10 mg by mouth daily.    . WARFARIN SODIUM 5 MG PO TABS Oral Take 5 mg by mouth daily. 1 tablet on mondays, wed, and fridays and 1/2 tablet all other days.  On hold for high INR til next week.      BP 127/70  Pulse 70  Temp 98.3 F (36.8 C) (Oral)  Resp 19  SpO2 94%  Physical Exam  Nursing note and vitals reviewed. Constitutional: He is oriented to person, place, and time. He appears well-developed and well-nourished. No distress.  HENT:  Head: Normocephalic and atraumatic.  Mouth/Throat: Oropharynx is clear and moist.  Eyes: Conjunctivae and EOM are normal. Pupils are equal, round, and reactive to light.  Neck: Normal range of motion. Neck supple.  Cardiovascular: Normal rate, regular rhythm and normal heart sounds.   No murmur heard. Pulmonary/Chest: Effort normal and breath sounds normal. No respiratory distress. He has no  wheezes. He has no rales. He exhibits no tenderness.  Abdominal: Soft. Bowel sounds are normal. There is no tenderness.  Musculoskeletal: Normal range of motion. He exhibits no edema and no tenderness.  Neurological: He is alert and oriented to person, place, and time. No cranial nerve deficit. He exhibits normal muscle tone. Coordination normal.  Skin:  Skin is warm. Rash noted.    ED Course  Procedures (including critical care time)  Labs Reviewed  CBC WITH DIFFERENTIAL - Abnormal; Notable for the following:    RBC 3.97 (*)     All other components within normal limits  COMPREHENSIVE METABOLIC PANEL - Abnormal; Notable for the following:    Glucose, Bld 169 (*)     BUN 33 (*)     Creatinine, Ser 1.74 (*)     Albumin 3.4 (*)     AST 50 (*)     GFR calc non Af Amer 34 (*)     GFR calc Af Amer 39 (*)     All other components within normal limits  URINALYSIS, ROUTINE W REFLEX MICROSCOPIC - Abnormal; Notable for the following:    Bilirubin Urine SMALL (*)     Protein, ur 30 (*)     All other components within normal limits  GLUCOSE, CAPILLARY - Abnormal; Notable for the following:    Glucose-Capillary 149 (*)     All other components within normal limits  URINE MICROSCOPIC-ADD ON - Abnormal; Notable for the following:    Bacteria, UA FEW (*)     All other components within normal limits  PROTIME-INR   Dg Chest 2 View  04/03/2012  *RADIOLOGY REPORT*  Clinical Data: Recurrent falls.  Weakness.  Coronary artery disease.  CHEST - 2 VIEW  Comparison: 11/17/2011  Findings: Triple lead transvenous pacemaker remains in appropriate position.  Mild cardiomegaly stable.  Both lungs are clear.  No evidence of pleural effusion.  No mass or lymphadenopathy identified.  Prior CABG again noted.  IMPRESSION: Stable mild cardiomegaly.  No active lung disease.   Original Report Authenticated By: Danae Orleans, M.D. ( 04/03/2012 10:44:54 )    Ct Head Wo Contrast  04/03/2012  *RADIOLOGY REPORT*   Clinical Data: Anticoagulated.  Recent falls with head trauma.  CT HEAD WITHOUT CONTRAST  Technique:  Contiguous axial images were obtained from the base of the skull through the vertex without contrast.  Comparison: 08/13/2010  Findings: The brain shows mild age related atrophy with mild chronic appearing small vessel change within the deep white matter. No sign of acute infarction, mass lesion, hemorrhage, hydrocephalus or extra-axial collection.  No skull fracture.  No fluid in the sinuses, middle ears or mastoids.  There is extensive atherosclerotic calcification of the major vessels at the base of the brain.  IMPRESSION: No acute or traumatic finding.  Mild age related atrophy and chronic small vessel disease.   Original Report Authenticated By: Thomasenia Sales, M.D. ( 04/03/2012 11:10:54 )     Date: 04/03/2012  Rate: 70  Rhythm: paced  QRS Axis: indeterminate  Intervals: paced  ST/T Wave abnormalities: nonspecific ST/T changes  Conduction Disutrbances:paced  Narrative Interpretation:   Old EKG Reviewed: unchanged From 03/31/12  Results for orders placed during the hospital encounter of 04/03/12  CBC WITH DIFFERENTIAL      Component Value Range   WBC 6.1  4.0 - 10.5 K/uL   RBC 3.97 (*) 4.22 - 5.81 MIL/uL   Hemoglobin 13.1  13.0 - 17.0 g/dL   HCT 46.9  62.9 - 52.8 %   MCV 99.2  78.0 - 100.0 fL   MCH 33.0  26.0 - 34.0 pg   MCHC 33.2  30.0 - 36.0 g/dL   RDW 41.3  24.4 - 01.0 %   Platelets 182  150 - 400 K/uL   Neutrophils Relative 62  43 - 77 %  Neutro Abs 3.8  1.7 - 7.7 K/uL   Lymphocytes Relative 28  12 - 46 %   Lymphs Abs 1.7  0.7 - 4.0 K/uL   Monocytes Relative 8  3 - 12 %   Monocytes Absolute 0.5  0.1 - 1.0 K/uL   Eosinophils Relative 2  0 - 5 %   Eosinophils Absolute 0.1  0.0 - 0.7 K/uL   Basophils Relative 1  0 - 1 %   Basophils Absolute 0.1  0.0 - 0.1 K/uL  COMPREHENSIVE METABOLIC PANEL      Component Value Range   Sodium 136  135 - 145 mEq/L   Potassium 4.3  3.5 - 5.1  mEq/L   Chloride 96  96 - 112 mEq/L   CO2 30  19 - 32 mEq/L   Glucose, Bld 169 (*) 70 - 99 mg/dL   BUN 33 (*) 6 - 23 mg/dL   Creatinine, Ser 1.61 (*) 0.50 - 1.35 mg/dL   Calcium 09.6  8.4 - 04.5 mg/dL   Total Protein 7.0  6.0 - 8.3 g/dL   Albumin 3.4 (*) 3.5 - 5.2 g/dL   AST 50 (*) 0 - 37 U/L   ALT 32  0 - 53 U/L   Alkaline Phosphatase 39  39 - 117 U/L   Total Bilirubin 0.5  0.3 - 1.2 mg/dL   GFR calc non Af Amer 34 (*) >90 mL/min   GFR calc Af Amer 39 (*) >90 mL/min  PROTIME-INR      Component Value Range   Prothrombin Time 14.0  11.6 - 15.2 seconds   INR 1.06  0.00 - 1.49  URINALYSIS, ROUTINE W REFLEX MICROSCOPIC      Component Value Range   Color, Urine YELLOW  YELLOW   APPearance CLEAR  CLEAR   Specific Gravity, Urine 1.021  1.005 - 1.030   pH 7.0  5.0 - 8.0   Glucose, UA NEGATIVE  NEGATIVE mg/dL   Hgb urine dipstick NEGATIVE  NEGATIVE   Bilirubin Urine SMALL (*) NEGATIVE   Ketones, ur NEGATIVE  NEGATIVE mg/dL   Protein, ur 30 (*) NEGATIVE mg/dL   Urobilinogen, UA 1.0  0.0 - 1.0 mg/dL   Nitrite NEGATIVE  NEGATIVE   Leukocytes, UA NEGATIVE  NEGATIVE  GLUCOSE, CAPILLARY      Component Value Range   Glucose-Capillary 149 (*) 70 - 99 mg/dL   Comment 1 Notify RN    URINE MICROSCOPIC-ADD ON      Component Value Range   Squamous Epithelial / LPF RARE  RARE   WBC, UA 0-2  <3 WBC/hpf   Bacteria, UA FEW (*) RARE      1. Weakness   2. Falls       MDM  Patient was referred in by Dr. Felipa Eth office on Friday for admission to wait in the ED was too long so they went home. Did not get any studies done before leaving.  Patient's Coumadin was stopped 8 days ago his INR is now normal. Head CT shows no evidence of head bleed or stroke patient is not able to get MRI because of pacemaker. discussed with his Dr. Vivianne Spence okay with him going home today they will arrange to their office [place] patient all and physical therapy at home. Patient's wife is okay with this plan.          Shelda Jakes, MD 04/03/12 [phone]

## 2012-04-03 NOTE — ED Notes (Signed)
Patient transported to X-ray 

## 2012-04-03 NOTE — ED Notes (Signed)
Pt's CBG was 149 and I let the rn Jessica aware of the results.10:53am JG.

## 2012-05-19 IMAGING — US US ABDOMEN COMPLETE
1 series · 14 of 25 positions shown · non-contrast
Comparison: None.

CLINICAL DATA: Abdominal pain

COMPLETE ABDOMINAL ULTRASOUND

[Series 1: us abdomen complete · 0.30mm/px · 14 of 53 slices shown]
[im 1/53]
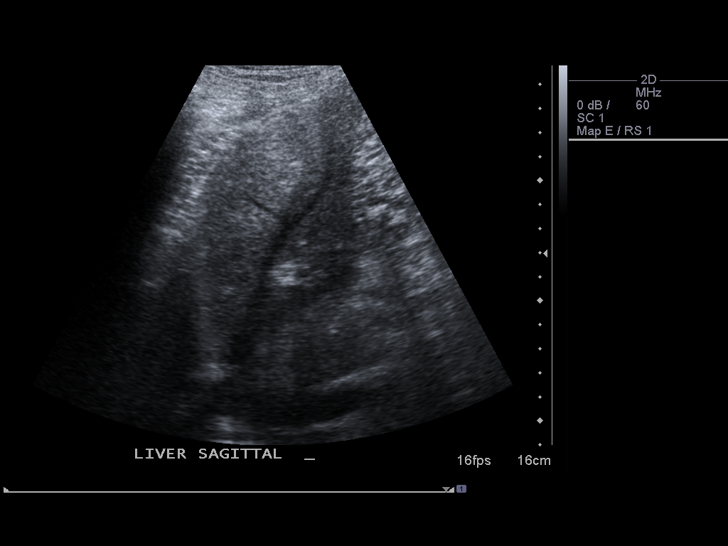
[im 5/53]
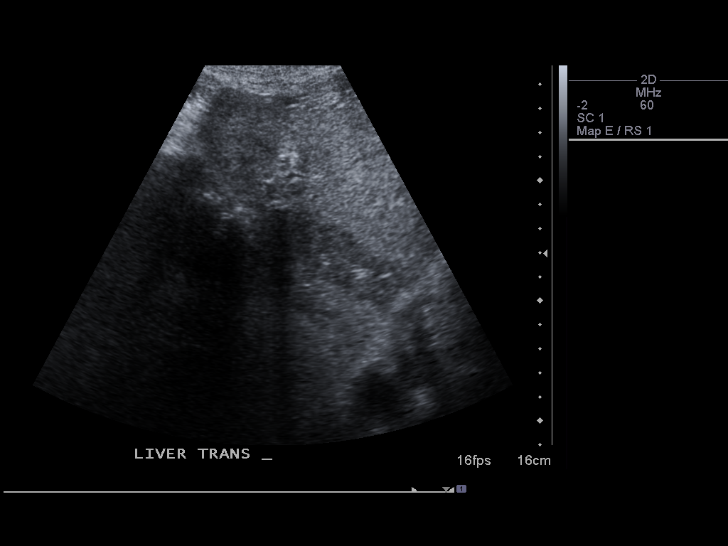
[im 9/53]
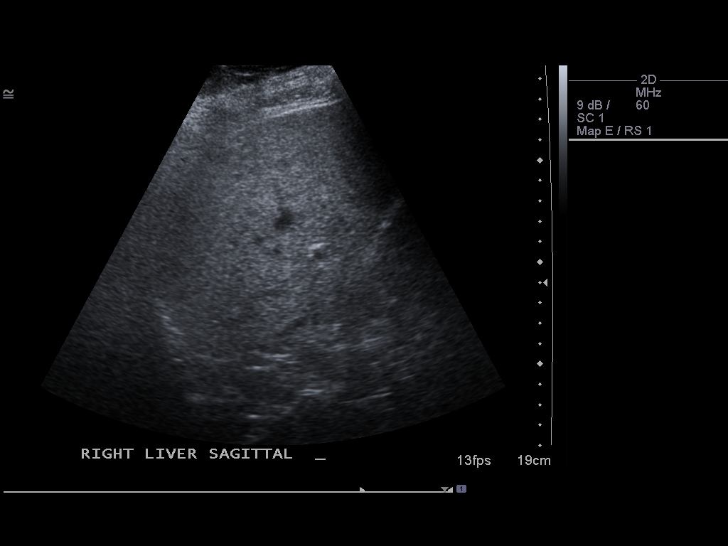
[im 14/53]
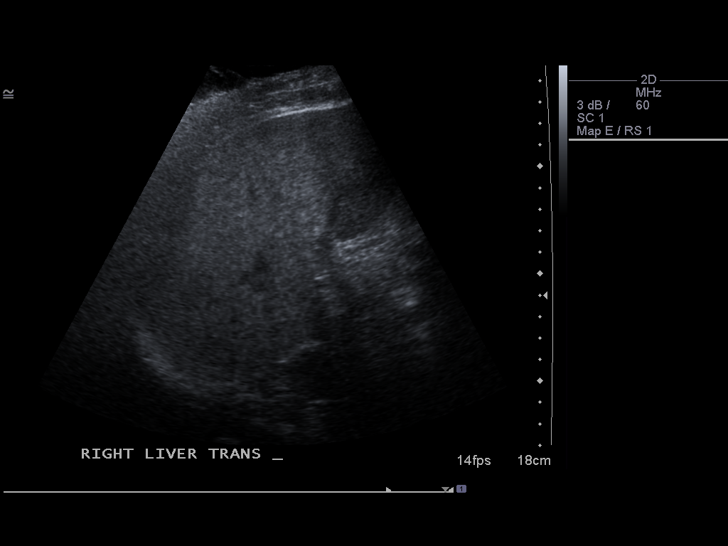
[im 18/53]
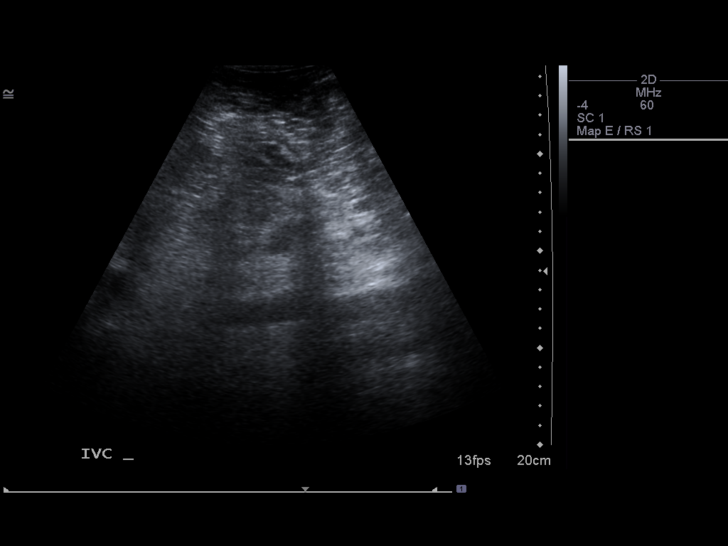
[im 20/53]
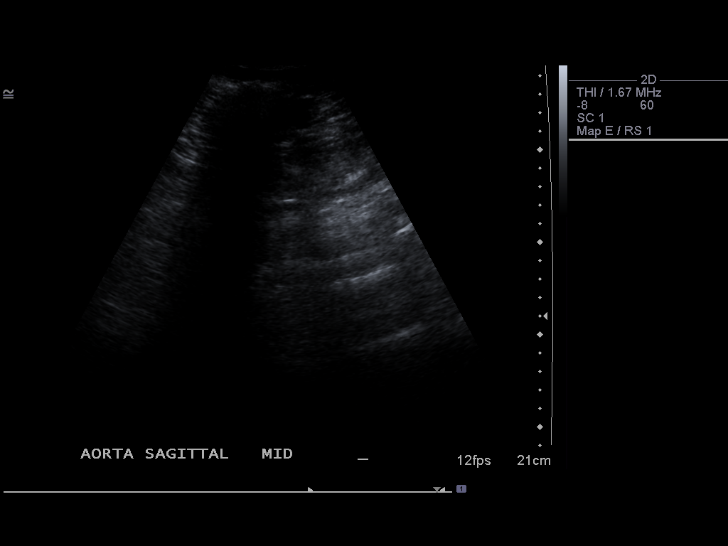
[im 24/53]
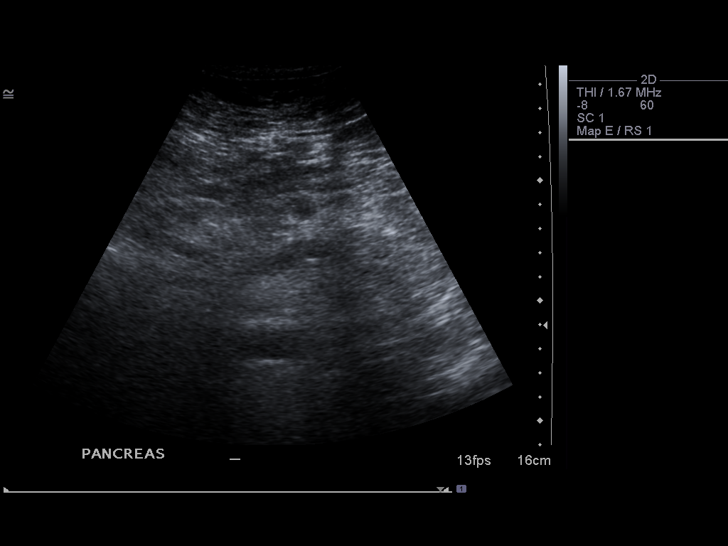
[im 29/53]
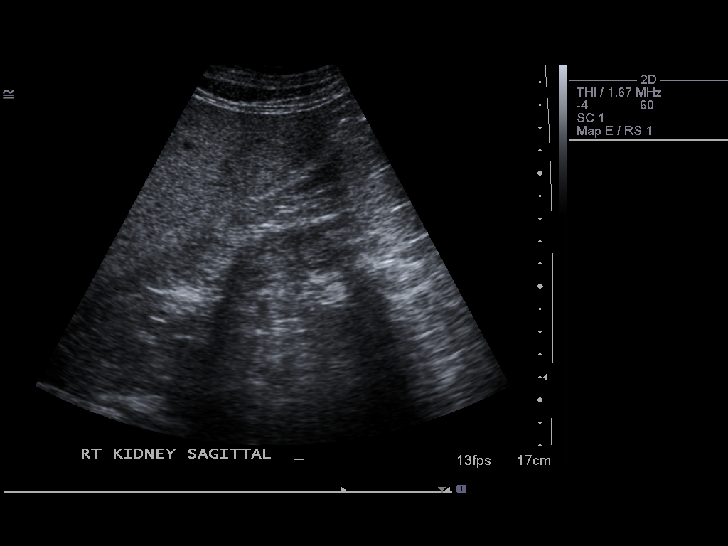
[im 33/53]
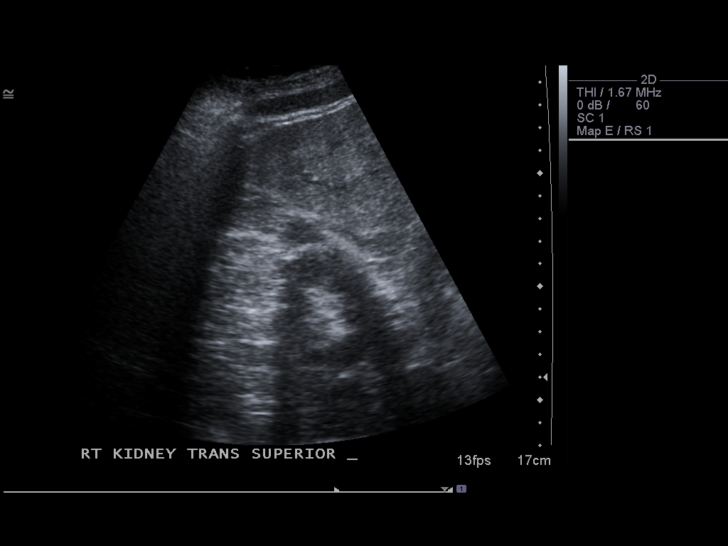
[im 35/53]
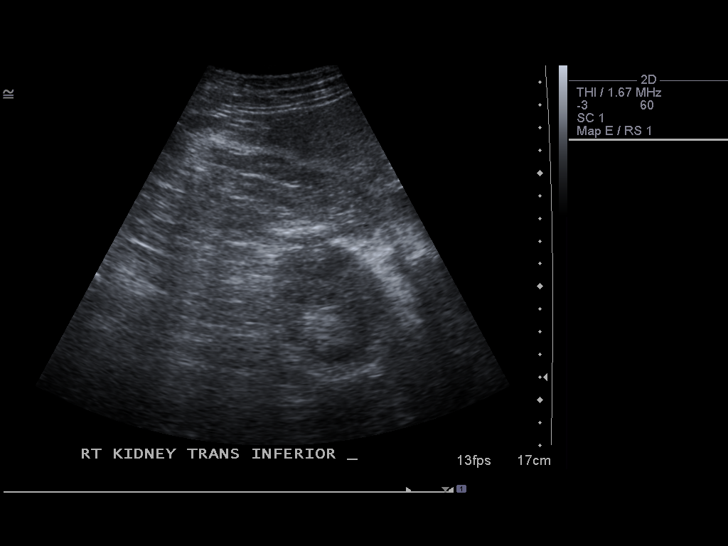
[im 40/53]
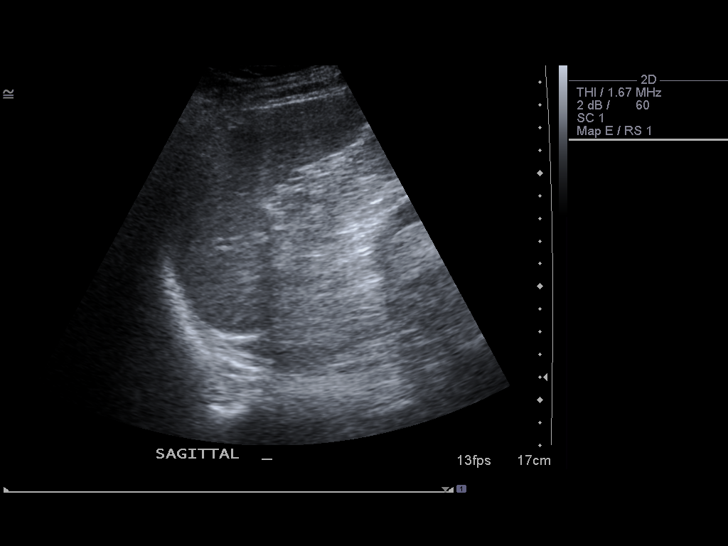
[im 44/53]
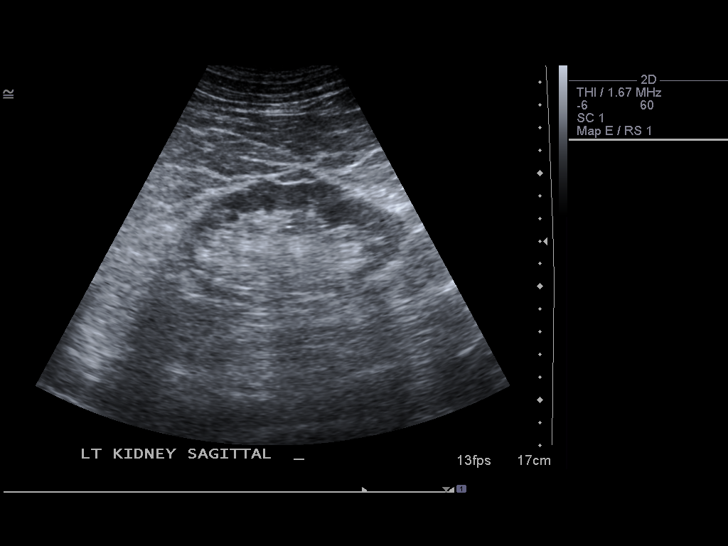
[im 48/53]
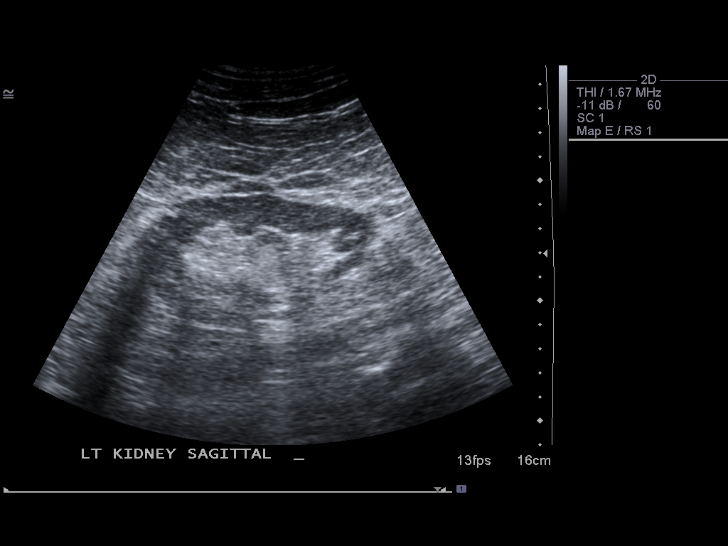
[im 53/53]
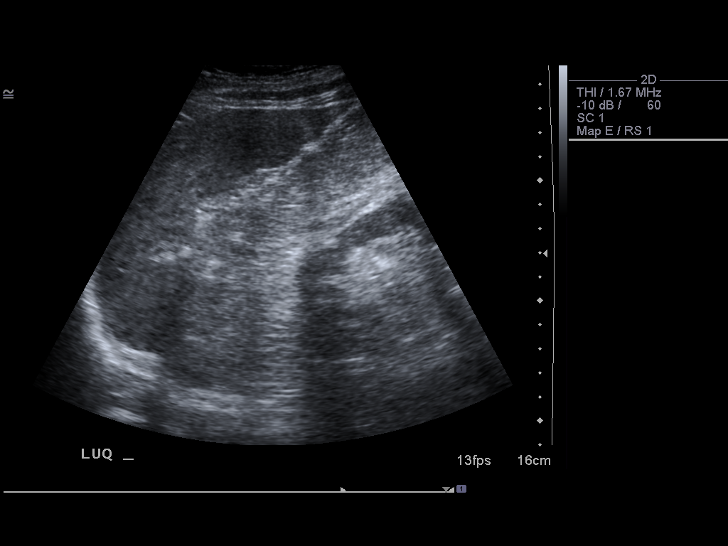

[14 of 25 positions shown; findings below may reference images not displayed]

FINDINGS: Gallbladder:  Surgically absent.

Common bile duct:  6.0 mm

Liver:  Heterogeneous echotexture due to fatty infiltration.  No
focal mass lesion.

IVC:  Appears normal.

Pancreas:  Limited evaluation

Spleen:  8.3 cm

Right Kidney:  11.8 cm without obstruction or mass peri

Left Kidney:  10.4 cm without obstruction or mass

Abdominal aorta:  .  Limited evaluation.
IMPRESSION: Fatty infiltration of the liver.  No acute abnormality.

## 2012-05-19 IMAGING — CR DG CHEST 1V PORT
1 series · 1 of 1 positions shown · non-contrast
Comparison: 08/11/2010

CLINICAL DATA: Shortness of breath.  Rule out aspiration.

PORTABLE CHEST - 1 VIEW

[AP]
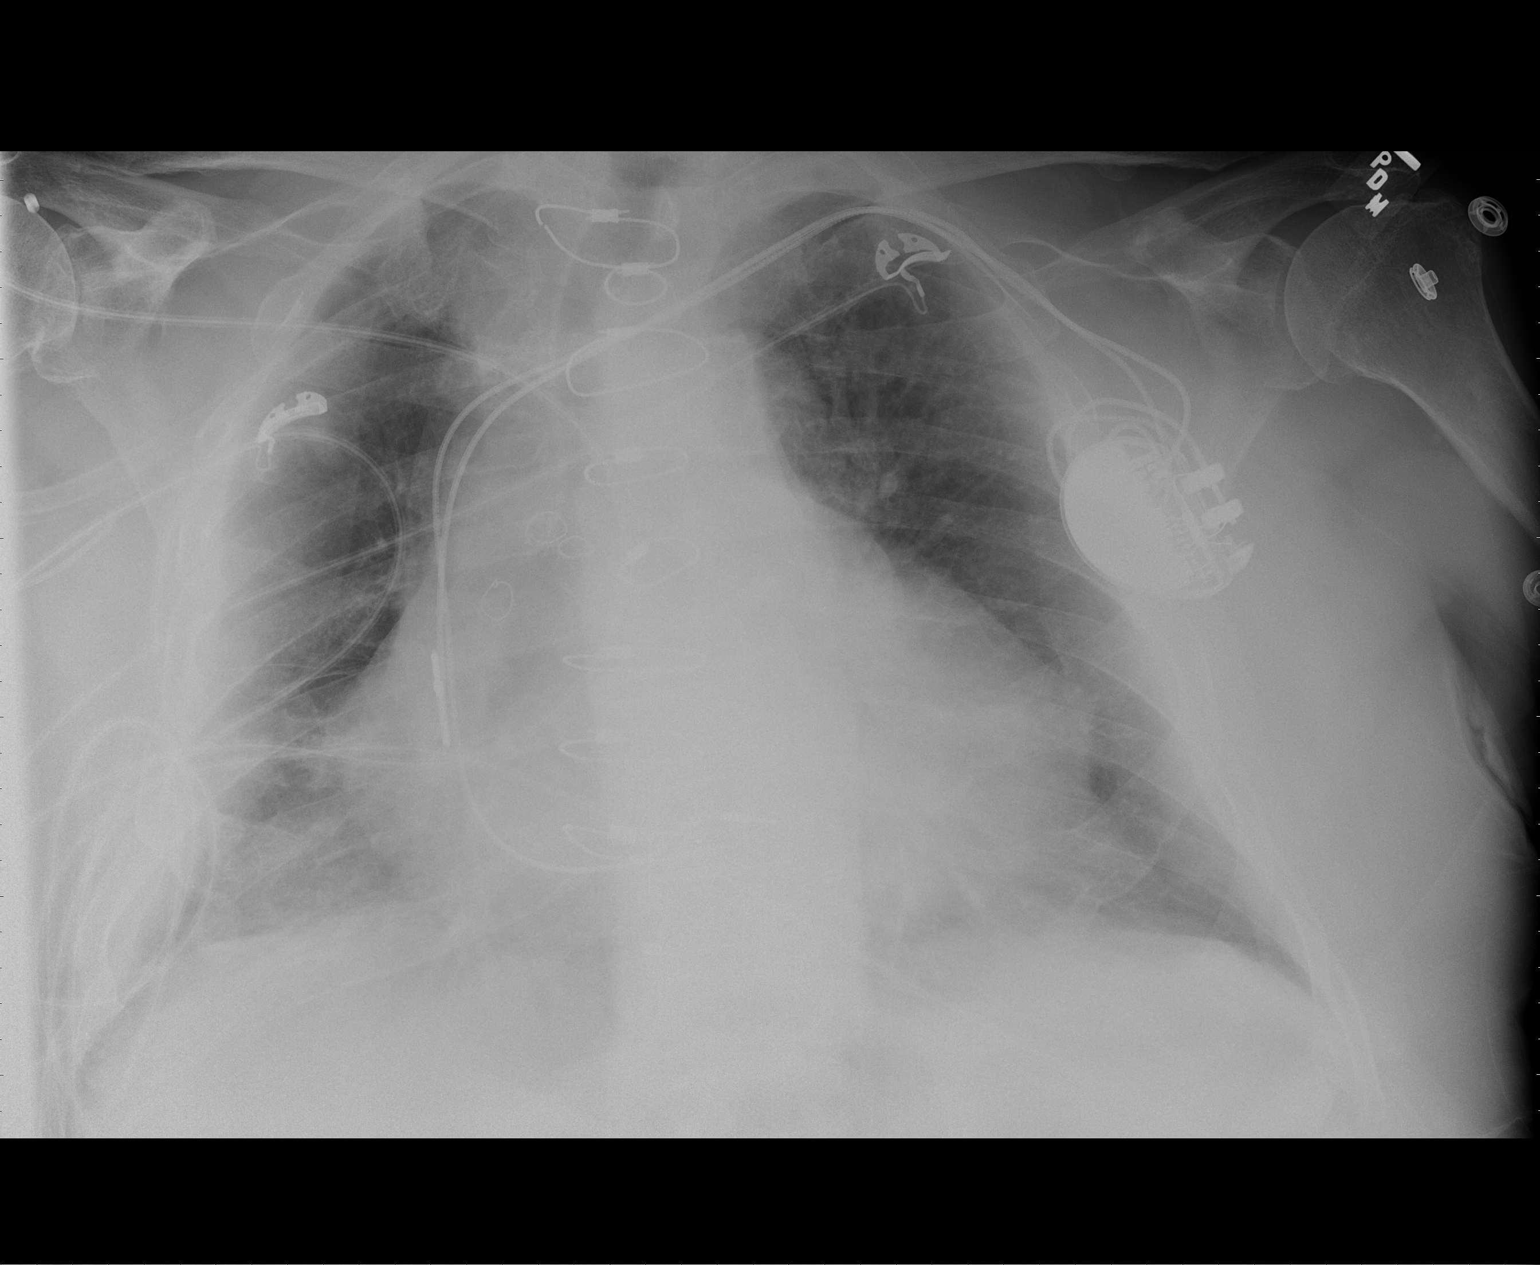

[1 of 1 positions shown; findings below may reference images not displayed]

FINDINGS: Dual lead pacer with leads right atrium right ventricle.
Prior median sternotomy.  An apical lordotic frontal view.  This
accentuates moderate cardiomegaly. No pleural effusion or
pneumothorax.  No congestive failure.

Apparent right greater than left bibasilar opacities are similar to
on the prior exam and favored to be related to the extent of apical
lordotic positioning.  The lungs are otherwise clear.
IMPRESSION: 1.  Cardiomegaly and low lung volumes without congestive failure.
2.  Apparent bibasilar opacities are favored to be secondary to the
extent of apical lordotic positioning.  Recommend attention on
follow-up.

## 2012-05-20 IMAGING — CR DG CHEST 1V PORT
1 series · 1 of 1 positions shown · non-contrast
Comparison: None.

CLINICAL DATA: Shortness of breath and CHF.  PICC line placement.

PORTABLE CHEST - 1 VIEW

[view not recorded]
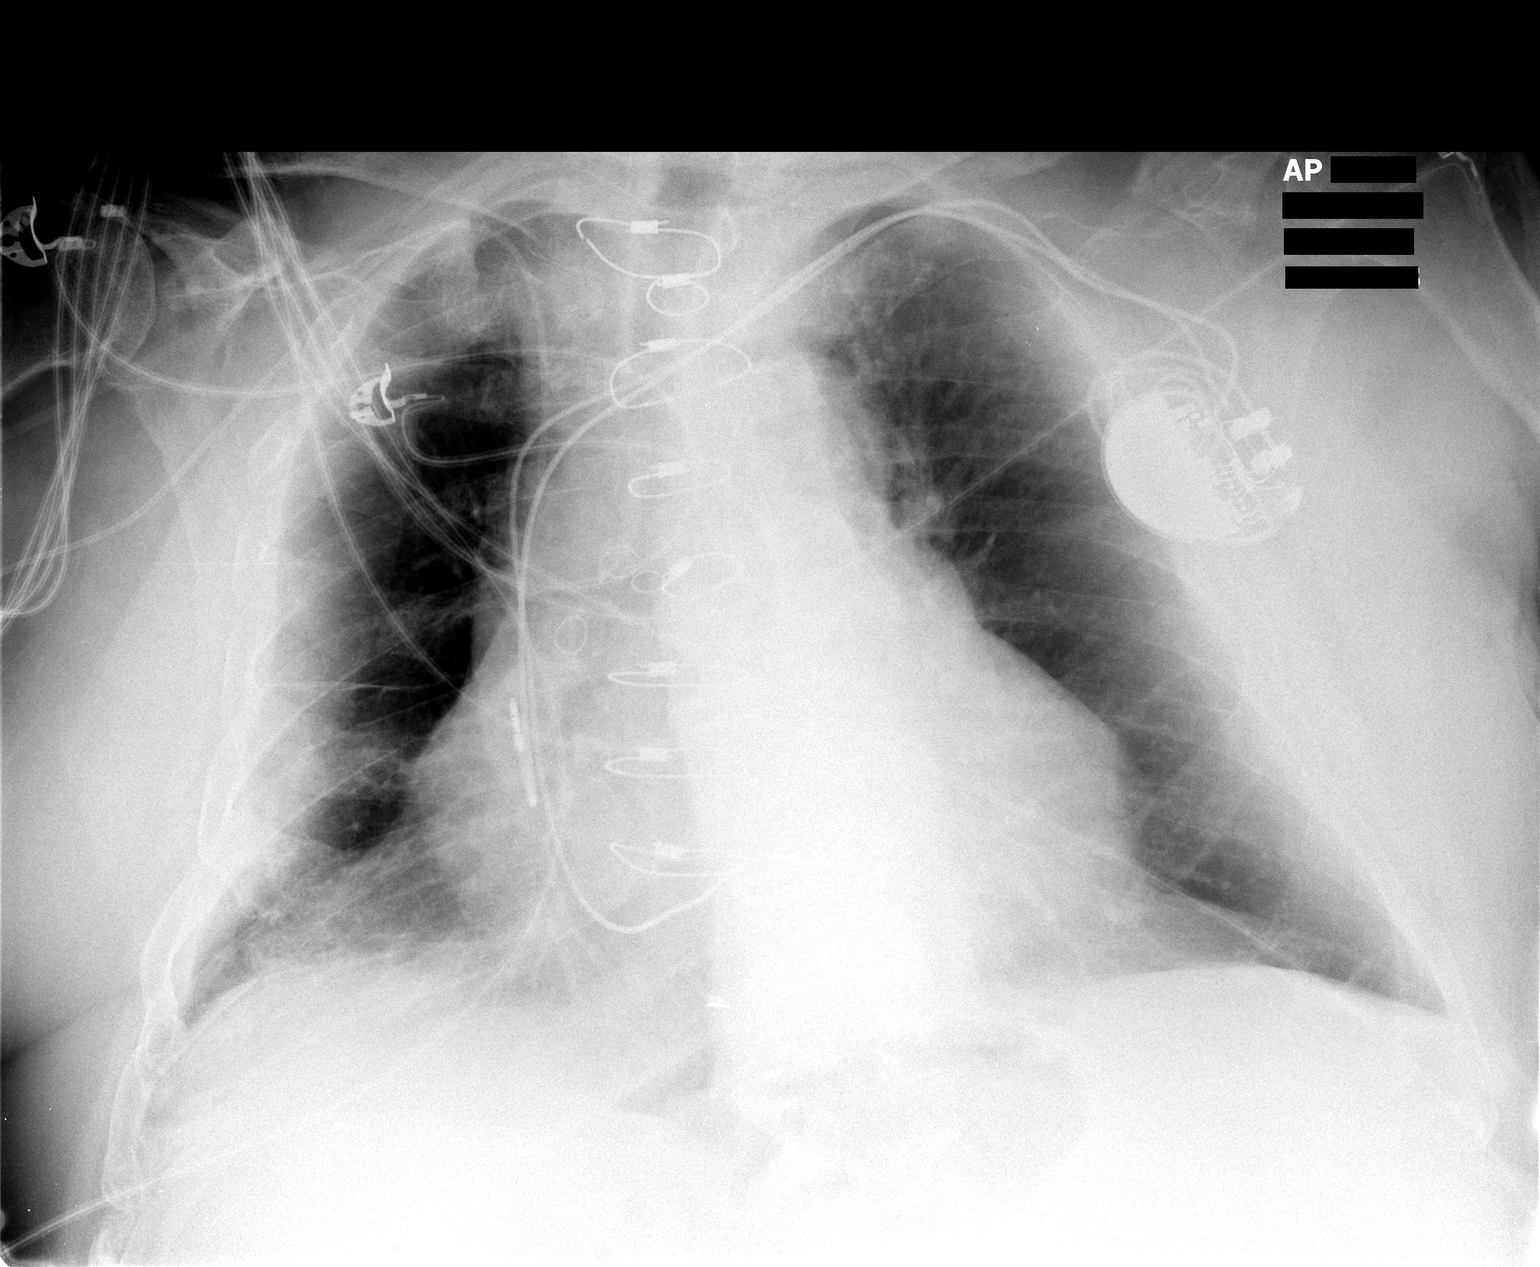

[1 of 1 positions shown; findings below may reference images not displayed]

FINDINGS: 3830 hours The cardiopericardial silhouette is enlarged.
Probable prominent bilateral fat pads as described on the previous
study.  Left-sided dual lead permanent pacemaker remains in place.
Right PICC line tip projects at the proximal SVC level.
IMPRESSION: Lordotic positioning with cardiomegaly.

Right PICC line tip projects at the proximal SVC level.

## 2012-08-01 ENCOUNTER — Other Ambulatory Visit (HOSPITAL_COMMUNITY): Payer: Self-pay | Admitting: Cardiovascular Disease

## 2012-08-01 DIAGNOSIS — I509 Heart failure, unspecified: Secondary | ICD-10-CM

## 2012-08-01 DIAGNOSIS — Z950 Presence of cardiac pacemaker: Secondary | ICD-10-CM

## 2012-08-05 IMAGING — CR DG CHEST 1V PORT
1 series · 1 of 1 positions shown · non-contrast
Comparison: 08/17/2010 and earlier.

CLINICAL DATA: 85-year-old male status post pacemaker.

PORTABLE CHEST - 1 VIEW

[AP]
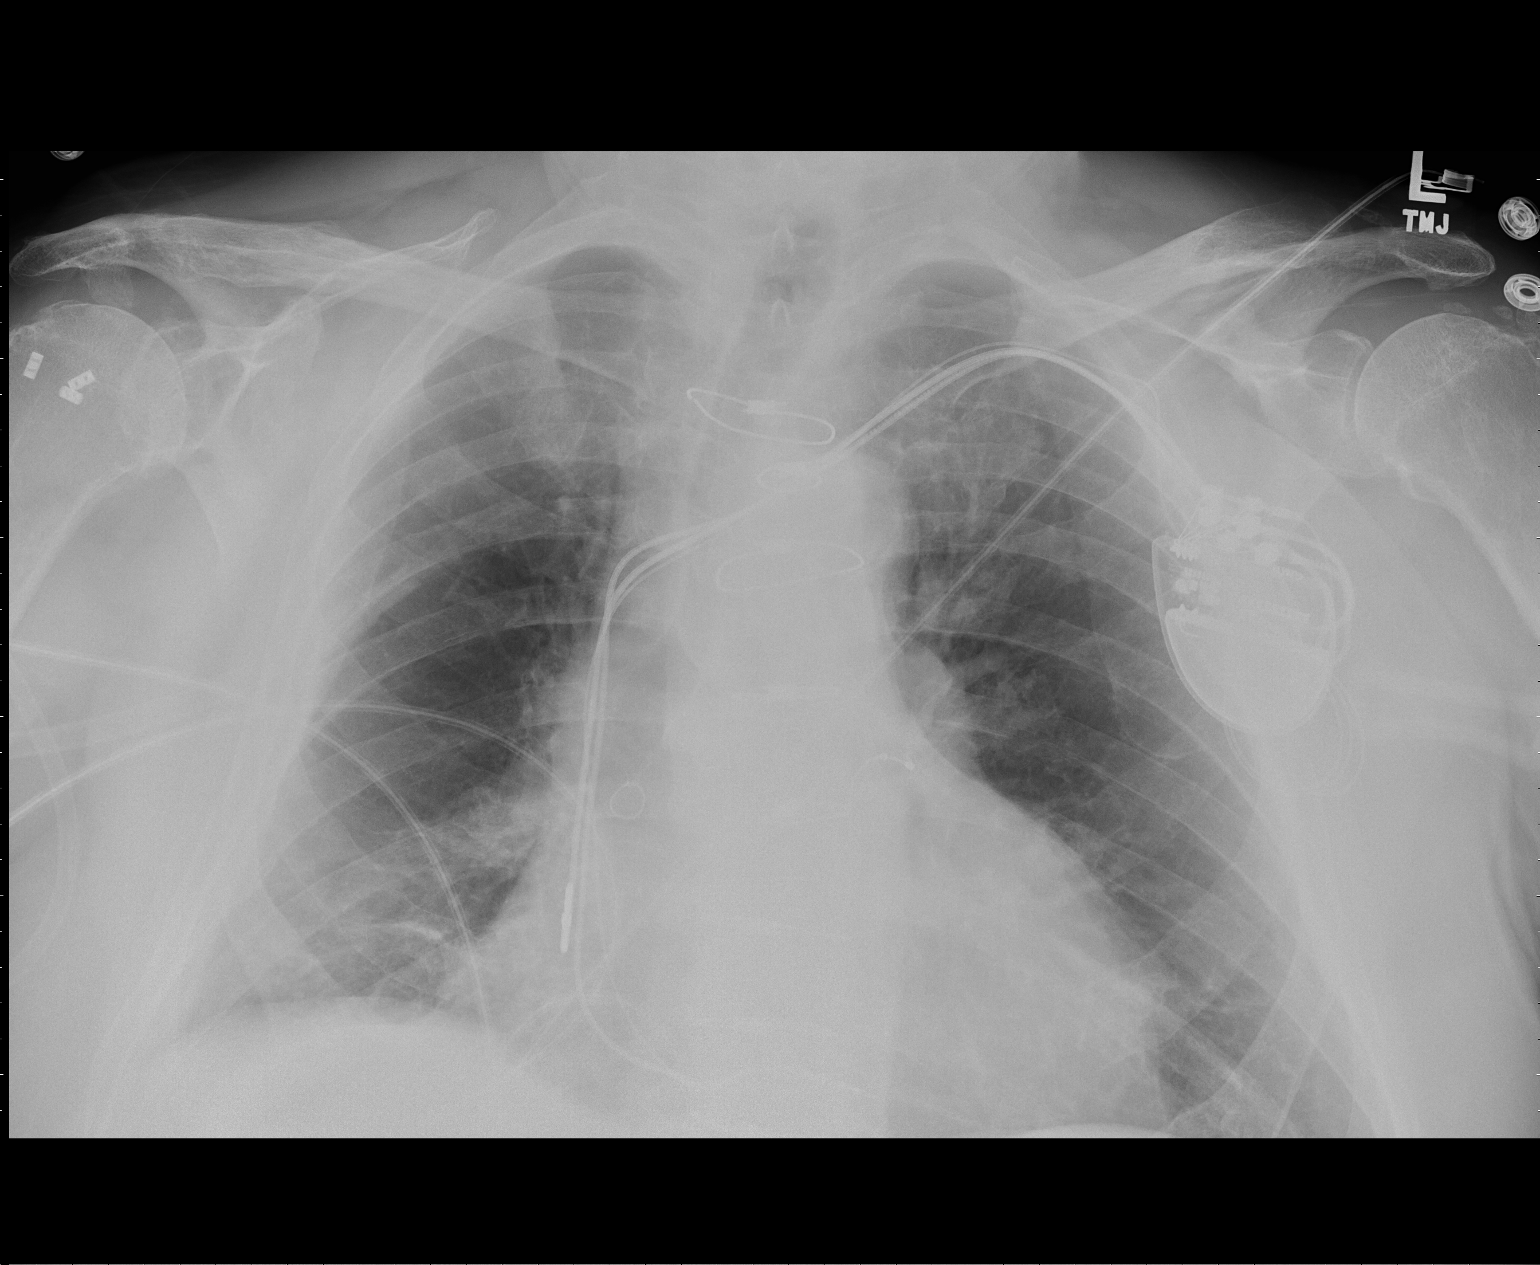

[1 of 1 positions shown; findings below may reference images not displayed]

FINDINGS: Semi upright AP portable view 4667 hours. Interval left
chest cardiac pacemaker revision now with three transvenous leads.
No pneumothorax or pulmonary edema. Stable cardiomegaly and
mediastinal contours.  No confluent pulmonary opacity.
IMPRESSION: Left chest cardiac pacemaker revision male with three transvenous
leads.  No pneumothorax or adverse features.

## 2012-08-17 ENCOUNTER — Ambulatory Visit (HOSPITAL_COMMUNITY): Payer: Medicare Other

## 2012-11-21 ENCOUNTER — Encounter: Payer: Self-pay | Admitting: Pharmacist Clinician (PhC)/ Clinical Pharmacy Specialist

## 2012-11-28 ENCOUNTER — Other Ambulatory Visit (HOSPITAL_COMMUNITY): Payer: Self-pay | Admitting: Cardiovascular Disease

## 2012-11-28 DIAGNOSIS — I519 Heart disease, unspecified: Secondary | ICD-10-CM

## 2012-11-28 DIAGNOSIS — Z950 Presence of cardiac pacemaker: Secondary | ICD-10-CM

## 2012-11-28 DIAGNOSIS — I509 Heart failure, unspecified: Secondary | ICD-10-CM

## 2012-12-15 ENCOUNTER — Ambulatory Visit (HOSPITAL_COMMUNITY)
Admission: RE | Admit: 2012-12-15 | Discharge: 2012-12-15 | Disposition: A | Discharge status: Home / Self Care | Payer: Medicare Other | Source: Ambulatory Visit | Attending: Cardiovascular Disease | Admitting: Cardiovascular Disease

## 2012-12-15 DIAGNOSIS — Z95810 Presence of automatic (implantable) cardiac defibrillator: Secondary | ICD-10-CM | POA: Insufficient documentation

## 2012-12-15 DIAGNOSIS — I2510 Atherosclerotic heart disease of native coronary artery without angina pectoris: Secondary | ICD-10-CM | POA: Insufficient documentation

## 2012-12-15 DIAGNOSIS — I519 Heart disease, unspecified: Secondary | ICD-10-CM

## 2012-12-15 DIAGNOSIS — I509 Heart failure, unspecified: Secondary | ICD-10-CM | POA: Insufficient documentation

## 2012-12-15 DIAGNOSIS — L97909 Non-pressure chronic ulcer of unspecified part of unspecified lower leg with unspecified severity: Secondary | ICD-10-CM | POA: Insufficient documentation

## 2012-12-15 DIAGNOSIS — Z950 Presence of cardiac pacemaker: Secondary | ICD-10-CM

## 2012-12-15 NOTE — Progress Notes (Signed)
Lower extremity arterial duplex complete. GMG

## 2012-12-15 NOTE — Progress Notes (Signed)
[facility]   2D echo completed 12/15/2012.   Cindy Jhostin Epps, RDCS  

## 2013-01-04 ENCOUNTER — Other Ambulatory Visit: Payer: Self-pay | Admitting: Physician Assistant

## 2013-01-05 ENCOUNTER — Other Ambulatory Visit: Payer: Self-pay

## 2013-01-05 MED ORDER — ISOSORBIDE DINITRATE 20 MG PO TABS: 20.0000 mg | ORAL_TABLET | Freq: Three times a day (TID) | ORAL | Status: AC

## 2013-01-05 MED ORDER — FUROSEMIDE 80 MG PO TABS: 80.0000 mg | ORAL_TABLET | Freq: Every day | ORAL | Status: DC

## 2013-01-05 MED ORDER — FUROSEMIDE 80 MG PO TABS: 80.0000 mg | ORAL_TABLET | Freq: Every day | ORAL | Status: AC

## 2013-01-05 MED ORDER — ISOSORBIDE DINITRATE 20 MG PO TABS: 20.0000 mg | ORAL_TABLET | Freq: Three times a day (TID) | ORAL | Status: DC

## 2013-01-05 NOTE — Addendum Note (Signed)
Addended by: Neta Ehlers on: 01/05/2013 10:51 AM   Modules accepted: Orders

## 2013-01-05 NOTE — Telephone Encounter (Signed)
Rx called in to pharmacy. 

## 2013-01-16 ENCOUNTER — Other Ambulatory Visit: Payer: Self-pay | Admitting: Physician Assistant

## 2013-01-16 NOTE — Telephone Encounter (Signed)
Sent refill

## 2013-02-22 ENCOUNTER — Telehealth: Payer: Self-pay | Admitting: Cardiovascular Disease

## 2013-02-22 NOTE — Telephone Encounter (Signed)
Pt's daughter called stating that

## 2013-03-16 DEATH — deceased

## 2014-07-25 ENCOUNTER — Encounter (HOSPITAL_COMMUNITY): Payer: Self-pay | Admitting: Cardiovascular Disease
